# Patient Record
Sex: Female | Born: 1945 | Race: Black or African American | Hispanic: No | Marital: Single | Smoking: Former smoker
Health system: Southern US, Community
[De-identification: ages and names within clinical notes are randomized; demographics above are authoritative.]

## PROBLEM LIST (undated history)

## (undated) DIAGNOSIS — E119 Type 2 diabetes mellitus without complications: Secondary | ICD-10-CM

## (undated) DIAGNOSIS — I10 Essential (primary) hypertension: Secondary | ICD-10-CM

## (undated) DIAGNOSIS — J449 Chronic obstructive pulmonary disease, unspecified: Secondary | ICD-10-CM

## (undated) DIAGNOSIS — I509 Heart failure, unspecified: Secondary | ICD-10-CM

---

## 2013-02-17 DIAGNOSIS — E162 Hypoglycemia, unspecified: Secondary | ICD-10-CM

## 2014-03-14 ENCOUNTER — Emergency Department (HOSPITAL_COMMUNITY): Payer: Medicare Other

## 2014-03-14 ENCOUNTER — Inpatient Hospital Stay (HOSPITAL_COMMUNITY)
Admission: EM | Admit: 2014-03-14 | Discharge: 2014-04-16 | DRG: 296 | Disposition: E | Payer: Medicare Other | Attending: Internal Medicine | Admitting: Internal Medicine

## 2014-03-14 ENCOUNTER — Encounter (HOSPITAL_COMMUNITY): Payer: Self-pay | Admitting: Emergency Medicine

## 2014-03-14 DIAGNOSIS — Z66 Do not resuscitate: Secondary | ICD-10-CM | POA: Diagnosis not present

## 2014-03-14 DIAGNOSIS — Z515 Encounter for palliative care: Secondary | ICD-10-CM | POA: Diagnosis not present

## 2014-03-14 DIAGNOSIS — I129 Hypertensive chronic kidney disease with stage 1 through stage 4 chronic kidney disease, or unspecified chronic kidney disease: Secondary | ICD-10-CM | POA: Diagnosis present

## 2014-03-14 DIAGNOSIS — J9600 Acute respiratory failure, unspecified whether with hypoxia or hypercapnia: Secondary | ICD-10-CM | POA: Diagnosis present

## 2014-03-14 DIAGNOSIS — N179 Acute kidney failure, unspecified: Secondary | ICD-10-CM

## 2014-03-14 DIAGNOSIS — E872 Acidosis, unspecified: Secondary | ICD-10-CM | POA: Diagnosis present

## 2014-03-14 DIAGNOSIS — G931 Anoxic brain damage, not elsewhere classified: Secondary | ICD-10-CM | POA: Diagnosis present

## 2014-03-14 DIAGNOSIS — R4020 Unspecified coma: Secondary | ICD-10-CM

## 2014-03-14 DIAGNOSIS — R4182 Altered mental status, unspecified: Secondary | ICD-10-CM

## 2014-03-14 DIAGNOSIS — Z6841 Body Mass Index (BMI) 40.0 and over, adult: Secondary | ICD-10-CM | POA: Diagnosis not present

## 2014-03-14 DIAGNOSIS — G4733 Obstructive sleep apnea (adult) (pediatric): Secondary | ICD-10-CM | POA: Diagnosis present

## 2014-03-14 DIAGNOSIS — Z8673 Personal history of transient ischemic attack (TIA), and cerebral infarction without residual deficits: Secondary | ICD-10-CM | POA: Diagnosis not present

## 2014-03-14 DIAGNOSIS — N189 Chronic kidney disease, unspecified: Secondary | ICD-10-CM | POA: Diagnosis present

## 2014-03-14 DIAGNOSIS — N390 Urinary tract infection, site not specified: Secondary | ICD-10-CM | POA: Diagnosis present

## 2014-03-14 DIAGNOSIS — R7881 Bacteremia: Secondary | ICD-10-CM | POA: Diagnosis present

## 2014-03-14 DIAGNOSIS — B351 Tinea unguium: Secondary | ICD-10-CM | POA: Diagnosis present

## 2014-03-14 DIAGNOSIS — E662 Morbid (severe) obesity with alveolar hypoventilation: Secondary | ICD-10-CM | POA: Diagnosis present

## 2014-03-14 DIAGNOSIS — E785 Hyperlipidemia, unspecified: Secondary | ICD-10-CM | POA: Diagnosis present

## 2014-03-14 DIAGNOSIS — I2609 Other pulmonary embolism with acute cor pulmonale: Secondary | ICD-10-CM | POA: Diagnosis present

## 2014-03-14 DIAGNOSIS — J449 Chronic obstructive pulmonary disease, unspecified: Secondary | ICD-10-CM | POA: Diagnosis present

## 2014-03-14 DIAGNOSIS — I509 Heart failure, unspecified: Secondary | ICD-10-CM | POA: Diagnosis present

## 2014-03-14 DIAGNOSIS — Z87891 Personal history of nicotine dependence: Secondary | ICD-10-CM

## 2014-03-14 DIAGNOSIS — F29 Unspecified psychosis not due to a substance or known physiological condition: Secondary | ICD-10-CM | POA: Diagnosis present

## 2014-03-14 DIAGNOSIS — B954 Other streptococcus as the cause of diseases classified elsewhere: Secondary | ICD-10-CM | POA: Diagnosis present

## 2014-03-14 DIAGNOSIS — Z794 Long term (current) use of insulin: Secondary | ICD-10-CM | POA: Diagnosis not present

## 2014-03-14 DIAGNOSIS — J4489 Other specified chronic obstructive pulmonary disease: Secondary | ICD-10-CM | POA: Diagnosis present

## 2014-03-14 DIAGNOSIS — B070 Plantar wart: Secondary | ICD-10-CM | POA: Diagnosis present

## 2014-03-14 DIAGNOSIS — I498 Other specified cardiac arrhythmias: Secondary | ICD-10-CM | POA: Diagnosis present

## 2014-03-14 DIAGNOSIS — A498 Other bacterial infections of unspecified site: Secondary | ICD-10-CM | POA: Diagnosis present

## 2014-03-14 DIAGNOSIS — E1169 Type 2 diabetes mellitus with other specified complication: Secondary | ICD-10-CM | POA: Diagnosis present

## 2014-03-14 DIAGNOSIS — B9620 Unspecified Escherichia coli [E. coli] as the cause of diseases classified elsewhere: Secondary | ICD-10-CM

## 2014-03-14 DIAGNOSIS — I959 Hypotension, unspecified: Secondary | ICD-10-CM | POA: Diagnosis not present

## 2014-03-14 DIAGNOSIS — N170 Acute kidney failure with tubular necrosis: Secondary | ICD-10-CM | POA: Diagnosis present

## 2014-03-14 DIAGNOSIS — I5020 Unspecified systolic (congestive) heart failure: Secondary | ICD-10-CM | POA: Diagnosis present

## 2014-03-14 DIAGNOSIS — I50810 Right heart failure, unspecified: Secondary | ICD-10-CM

## 2014-03-14 DIAGNOSIS — IMO0002 Reserved for concepts with insufficient information to code with codable children: Secondary | ICD-10-CM | POA: Diagnosis present

## 2014-03-14 DIAGNOSIS — I469 Cardiac arrest, cause unspecified: Secondary | ICD-10-CM | POA: Diagnosis present

## 2014-03-14 DIAGNOSIS — J690 Pneumonitis due to inhalation of food and vomit: Secondary | ICD-10-CM | POA: Diagnosis present

## 2014-03-14 DIAGNOSIS — J9601 Acute respiratory failure with hypoxia: Secondary | ICD-10-CM

## 2014-03-14 DIAGNOSIS — B955 Unspecified streptococcus as the cause of diseases classified elsewhere: Secondary | ICD-10-CM

## 2014-03-14 DIAGNOSIS — G9340 Encephalopathy, unspecified: Secondary | ICD-10-CM | POA: Diagnosis present

## 2014-03-14 HISTORY — DX: Heart failure, unspecified: I50.9

## 2014-03-14 HISTORY — DX: Chronic obstructive pulmonary disease, unspecified: J44.9

## 2014-03-14 HISTORY — DX: Essential (primary) hypertension: I10

## 2014-03-14 HISTORY — DX: Type 2 diabetes mellitus without complications: E11.9

## 2014-03-14 LAB — URINALYSIS, ROUTINE W REFLEX MICROSCOPIC
GLUCOSE, UA: NEGATIVE mg/dL
Ketones, ur: NEGATIVE mg/dL
Nitrite: NEGATIVE
PH: 7 (ref 5.0–8.0)
Protein, ur: >300 mg/dL — AB
SPECIFIC GRAVITY, URINE: 1.015 (ref 1.005–1.030)
Urobilinogen, UA: 1 mg/dL (ref 0.0–1.0)

## 2014-03-14 LAB — CBC WITH DIFFERENTIAL/PLATELET
BASOS ABS: 0 10*3/uL (ref 0.0–0.1)
BASOS PCT: 0 % (ref 0–1)
Eosinophils Absolute: 0 10*3/uL (ref 0.0–0.7)
Eosinophils Relative: 0 % (ref 0–5)
HCT: 33.5 % — ABNORMAL LOW (ref 36.0–46.0)
Hemoglobin: 10.9 g/dL — ABNORMAL LOW (ref 12.0–15.0)
Lymphocytes Relative: 5 % — ABNORMAL LOW (ref 12–46)
Lymphs Abs: 0.6 10*3/uL — ABNORMAL LOW (ref 0.7–4.0)
MCH: 26.7 pg (ref 26.0–34.0)
MCHC: 32.5 g/dL (ref 30.0–36.0)
MCV: 82.1 fL (ref 78.0–100.0)
MONOS PCT: 6 % (ref 3–12)
Monocytes Absolute: 0.7 10*3/uL (ref 0.1–1.0)
Neutro Abs: 10.4 10*3/uL — ABNORMAL HIGH (ref 1.7–7.7)
Neutrophils Relative %: 89 % — ABNORMAL HIGH (ref 43–77)
Platelets: 190 10*3/uL (ref 150–400)
RBC: 4.08 MIL/uL (ref 3.87–5.11)
RDW: 19.7 % — ABNORMAL HIGH (ref 11.5–15.5)
WBC: 11.7 10*3/uL — ABNORMAL HIGH (ref 4.0–10.5)

## 2014-03-14 LAB — COMPREHENSIVE METABOLIC PANEL
ALK PHOS: 114 U/L (ref 39–117)
ALT: 28 U/L (ref 0–35)
AST: 57 U/L — AB (ref 0–37)
Albumin: 3.3 g/dL — ABNORMAL LOW (ref 3.5–5.2)
BILIRUBIN TOTAL: 2.5 mg/dL — AB (ref 0.3–1.2)
BUN: 37 mg/dL — AB (ref 6–23)
CO2: 20 mEq/L (ref 19–32)
Calcium: 9.2 mg/dL (ref 8.4–10.5)
Chloride: 99 mEq/L (ref 96–112)
Creatinine, Ser: 2.01 mg/dL — ABNORMAL HIGH (ref 0.50–1.10)
GFR calc Af Amer: 29 mL/min — ABNORMAL LOW (ref >90)
GFR calc non Af Amer: 25 mL/min — ABNORMAL LOW (ref >90)
Glucose, Bld: 44 mg/dL — CL (ref 70–99)
POTASSIUM: 4.7 meq/L (ref 3.7–5.3)
Sodium: 140 mEq/L (ref 137–147)
Total Protein: 7.7 g/dL (ref 6.0–8.3)

## 2014-03-14 LAB — TROPONIN I: Troponin I: <0.3 ng/mL (ref <0.30)

## 2014-03-14 LAB — I-STAT ARTERIAL BLOOD GAS, ED
Acid-base deficit: 4 mmol/L — ABNORMAL HIGH (ref 0.0–2.0)
Bicarbonate: 24.2 mEq/L — ABNORMAL HIGH (ref 20.0–24.0)
O2 SAT: 94 %
Patient temperature: 35.1
TCO2: 26 mmol/L (ref 0–100)
pCO2 arterial: 50.1 mmHg — ABNORMAL HIGH (ref 35.0–45.0)
pH, Arterial: 7.282 — ABNORMAL LOW (ref 7.350–7.450)
pO2, Arterial: 72 mmHg — ABNORMAL LOW (ref 80.0–100.0)

## 2014-03-14 LAB — URINE MICROSCOPIC-ADD ON

## 2014-03-14 LAB — PROTIME-INR
INR: 1.78 — ABNORMAL HIGH (ref 0.00–1.49)
Prothrombin Time: 20.2 seconds — ABNORMAL HIGH (ref 11.6–15.2)

## 2014-03-14 LAB — APTT: aPTT: 32 seconds (ref 24–37)

## 2014-03-14 LAB — I-STAT CG4 LACTIC ACID, ED: Lactic Acid, Venous: 6.65 mmol/L — ABNORMAL HIGH (ref 0.5–2.2)

## 2014-03-14 MED ORDER — FENTANYL BOLUS VIA INFUSION
50.0000 ug | INTRAVENOUS | Status: DC | PRN
  Filled 2014-03-14: qty 50

## 2014-03-14 MED ORDER — SODIUM CHLORIDE 0.9 % IV SOLN
25.0000 ug/h | INTRAVENOUS | Status: DC
  Administered 2014-03-15: 50 ug/h via INTRAVENOUS
  Administered 2014-03-16: 100 ug/h via INTRAVENOUS
  Filled 2014-03-14 (×2): qty 50

## 2014-03-14 MED ORDER — SODIUM CHLORIDE 0.9 % IV SOLN: 2000.0000 mL | Freq: Once | INTRAVENOUS | Status: DC

## 2014-03-14 MED ORDER — SODIUM CHLORIDE 0.9 % IV SOLN
1.0000 ug/kg/min | INTRAVENOUS | Status: DC
  Administered 2014-03-15 – 2014-03-16 (×3): 1.5 ug/kg/min via INTRAVENOUS
  Filled 2014-03-14 (×3): qty 20

## 2014-03-14 MED ORDER — NOREPINEPHRINE BITARTRATE 1 MG/ML IV SOLN
0.5000 ug/min | INTRAVENOUS | Status: DC
  Filled 2014-03-14: qty 4

## 2014-03-14 MED ORDER — PROPOFOL 10 MG/ML IV EMUL
5.0000 ug/kg/min | INTRAVENOUS | Status: DC
  Administered 2014-03-15 – 2014-03-16 (×10): 20 ug/kg/min via INTRAVENOUS
  Filled 2014-03-14 (×11): qty 100

## 2014-03-14 MED ORDER — CISATRACURIUM BOLUS VIA INFUSION
0.1000 mg/kg | Freq: Once | INTRAVENOUS | Status: AC
  Administered 2014-03-14: 18.1 mg via INTRAVENOUS
  Filled 2014-03-14: qty 19

## 2014-03-14 MED ORDER — SODIUM CHLORIDE 0.9 % IV SOLN
2000.0000 mL | Freq: Once | INTRAVENOUS | Status: AC
  Administered 2014-03-14: 2000 mL via INTRAVENOUS

## 2014-03-14 MED ORDER — CISATRACURIUM BOLUS VIA INFUSION
0.0500 mg/kg | INTRAVENOUS | Status: AC | PRN
  Administered 2014-03-15: 9.1 mg via INTRAVENOUS
  Filled 2014-03-14: qty 10

## 2014-03-14 MED ORDER — ARTIFICIAL TEARS OP OINT
1.0000 "application " | TOPICAL_OINTMENT | Freq: Three times a day (TID) | OPHTHALMIC | Status: DC
  Administered 2014-03-15 – 2014-03-16 (×6): 1 via OPHTHALMIC
  Filled 2014-03-14: qty 3.5

## 2014-03-14 MED ORDER — ETOMIDATE 2 MG/ML IV SOLN
30.0000 mg | Freq: Once | INTRAVENOUS | Status: AC
  Administered 2014-03-14: 30 mg via INTRAVENOUS

## 2014-03-14 MED ORDER — ASPIRIN 300 MG RE SUPP
300.0000 mg | RECTAL | Status: AC
  Administered 2014-03-15: 300 mg via RECTAL
  Filled 2014-03-14: qty 1

## 2014-03-14 MED ORDER — NOREPINEPHRINE BITARTRATE 1 MG/ML IV SOLN: 0.5000 ug/min | INTRAVENOUS | Status: DC

## 2014-03-14 MED ORDER — SODIUM CHLORIDE 0.9 % IV SOLN: 1.0000 ug/kg/min | INTRAVENOUS | Status: DC

## 2014-03-14 MED ORDER — NOREPINEPHRINE BITARTRATE 1 MG/ML IV SOLN
0.5000 ug/min | INTRAVENOUS | Status: DC
  Administered 2014-03-16 (×2): 12 ug/min via INTRAVENOUS
  Administered 2014-03-16: 5 ug/min via INTRAVENOUS
  Filled 2014-03-14 (×2): qty 4

## 2014-03-14 MED ORDER — HEPARIN SODIUM (PORCINE) 5000 UNIT/ML IJ SOLN
5000.0000 [IU] | Freq: Three times a day (TID) | INTRAMUSCULAR | Status: DC
  Administered 2014-03-15 – 2014-03-16 (×6): 5000 [IU] via SUBCUTANEOUS
  Filled 2014-03-14 (×7): qty 1

## 2014-03-14 MED ORDER — SUCCINYLCHOLINE CHLORIDE 20 MG/ML IJ SOLN
150.0000 mg | Freq: Once | INTRAMUSCULAR | Status: AC
  Administered 2014-03-14: 150 mg via INTRAVENOUS

## 2014-03-14 MED ORDER — MIDAZOLAM HCL 5 MG/ML IJ SOLN
1.0000 mg/h | INTRAMUSCULAR | Status: DC
  Filled 2014-03-14: qty 10

## 2014-03-14 MED ORDER — DEXTROSE 50 % IV SOLN
1.0000 | Freq: Once | INTRAVENOUS | Status: AC
  Administered 2014-03-14: 50 mL via INTRAVENOUS
  Filled 2014-03-14: qty 50

## 2014-03-14 NOTE — ED Notes (Signed)
Dr. Goldston at bedside.  

## 2014-03-14 NOTE — ED Notes (Signed)
Pulse check- good central pulses found by Elliot Gurney, Charity fundraiser.

## 2014-03-14 NOTE — Progress Notes (Signed)
Chaplain responded to CPR. No family present at this time.

## 2014-03-14 NOTE — ED Notes (Signed)
Respiratory therapist at bedside.

## 2014-03-14 NOTE — ED Notes (Signed)
IV infiltrated at this time. Verbal order from Dr. Marry Guan to hold all IV medications, to get patient to inpatient as soon as possible and he will insert central line upstairs. Patient remains unresponsive at this time. VSS.

## 2014-03-14 NOTE — ED Notes (Signed)
Patient presents to ED via [facility]. Initial EMS call by family was for "GI bleed." EMS arrived to find patient "unconscious and asystole"- son was performing CPR. EMS took over with CPR for approx 13 minutes and had a spontaneous return of pulses after 2 rounds of EPI. No shocks administered. King airway placed by EMS. NSR with return of pulses. 78/56. 128 bpm. IO in left leg placed by EMS. Pulses present upon arrival to ED.

## 2014-03-14 NOTE — ED Provider Notes (Signed)
CSN: [id number]     Arrival date & time    History   First MD Initiated Contact with Patient 2014/04/05 2158     Chief Complaint  Patient presents with  . Cardiac Arrest   Patient is a 68 y.o. female with PMH reported to be CHF, DM, and CVA who presents via EMS post CPR.  EMS reports they were called out for vaginal bleeding.  Upon their arrival, patient's was unresponsive and son was doing CPR.  EMS reports continued CPR and initial rhythm reported to be asystole.  Chest compressions for appx 13 minutes with 2 rounds of epi reported by EMS.  King airway placed.  ROSC was achieved and patient brought emergently to ED.  Additional information is not available at this time.    (Consider location/radiation/quality/duration/timing/severity/associated sxs/prior Treatment) Patient is a 68 y.o. female presenting with general illness. The history is provided by the patient and medical records. No language interpreter was used.  Illness Severity:  Severe Onset quality:  Unable to specify Timing:  Constant Progression:  Worsening Chronicity:  New   No past medical history on file. No past surgical history on file. No family history on file. History  Substance Use Topics  . Smoking status: Not on file  . Smokeless tobacco: Not on file  . Alcohol Use: Not on file   OB History   No data available     Review of Systems  Unable to perform ROS: Acuity of condition      Allergies  Review of patient's allergies indicates not on file.  Home Medications   Prior to Admission medications   Not on File   BP 130/81  Pulse 92  Resp 17  SpO2 92% Physical Exam  Nursing note and vitals reviewed. Constitutional: She appears well-developed and well-nourished.  HENT:  Head: Atraumatic.  Mouth/Throat: No oropharyngeal exudate (blood in OP).  Eyes: Pupils are equal, round, and reactive to light.  Neck: No tracheal deviation present.  Cardiovascular: Normal rate.   Pulmonary/Chest: She has no  wheezes. She has no rales.  King airway in place with bilateral breath sounds  Abdominal: Soft. Bowel sounds are normal. She exhibits no mass.  Obese abdomen  Musculoskeletal: She exhibits no edema.  Lymphadenopathy:    She has no cervical adenopathy.  Neurological: GCS eye subscore is 1. GCS verbal subscore is 1. GCS motor subscore is 1.  Patient with agonal respirations  Skin: Skin is warm and dry.    ED Course  INTUBATION Date/Time: 04/05/14 10:14 PM Performed by: Johnney Ou Authorized by: Pricilla Loveless T Consent: The procedure was performed in an emergent situation. Patient identity confirmed: arm band Time out: Immediately prior to procedure a "time out" was called to verify the correct patient, procedure, equipment, support staff and site/side marked as required. Indications: airway protection and  respiratory failure Intubation method: direct Patient status: paralyzed (RSI) Preoxygenation: King airway. Sedatives: etomidate Paralytic: succinylcholine Laryngoscope size: Mac 3 Tube size: 7.5 mm Tube type: cuffed Number of attempts: 2 Ventilation between attempts: BVM Cricoid pressure: no Cords visualized: yes Post-procedure assessment: chest rise and ETCO2 monitor Breath sounds: equal Cuff inflated: yes ETT to lip: 27 cm Tube secured with: ETT holder Chest x-ray interpreted by me, other physician and radiologist. Chest x-ray findings: endotracheal tube too low Tube repositioned: tube repositioned successfully Patient tolerance: Patient tolerated the procedure well with no immediate complications. Comments: Initial attempt with glidescope was unsuccessful as patient had copious amounts of blood in OP.     (  including critical care time) Labs Review Labs Reviewed  CBC WITH DIFFERENTIAL - Abnormal; Notable for the following:    WBC 11.7 (*)    Hemoglobin 10.9 (*)    HCT 33.5 (*)    RDW 19.7 (*)    Neutrophils Relative % 89 (*)    Neutro Abs 10.4 (*)     Lymphocytes Relative 5 (*)    Lymphs Abs 0.6 (*)    All other components within normal limits  COMPREHENSIVE METABOLIC PANEL - Abnormal; Notable for the following:    Glucose, Bld 44 (*)    BUN 37 (*)    Creatinine, Ser 2.01 (*)    Albumin 3.3 (*)    AST 57 (*)    Total Bilirubin 2.5 (*)    GFR calc non Af Amer 25 (*)    GFR calc Af Amer 29 (*)    All other components within normal limits  PROTIME-INR - Abnormal; Notable for the following:    Prothrombin Time 20.2 (*)    INR 1.78 (*)    All other components within normal limits  URINALYSIS, ROUTINE W REFLEX MICROSCOPIC - Abnormal; Notable for the following:    Color, Urine RED (*)    APPearance CLOUDY (*)    Hgb urine dipstick LARGE (*)    Bilirubin Urine SMALL (*)    Protein, ur >300 (*)    Leukocytes, UA LARGE (*)    All other components within normal limits  URINE MICROSCOPIC-ADD ON - Abnormal; Notable for the following:    Bacteria, UA MANY (*)    All other components within normal limits  I-STAT CG4 LACTIC ACID, ED - Abnormal; Notable for the following:    Lactic Acid, Venous 6.65 (*)    All other components within normal limits  I-STAT ARTERIAL BLOOD GAS, ED - Abnormal; Notable for the following:    pH, Arterial 7.282 (*)    pCO2 arterial 50.1 (*)    pO2, Arterial 72.0 (*)    Bicarbonate 24.2 (*)    Acid-base deficit 4.0 (*)    All other components within normal limits  CULTURE, BLOOD (ROUTINE X 2)  CULTURE, BLOOD (ROUTINE X 2)  URINE CULTURE  APTT  TROPONIN I  BLOOD GAS, ARTERIAL  BLOOD GAS, ARTERIAL  BASIC METABOLIC PANEL  BASIC METABOLIC PANEL  BASIC METABOLIC PANEL  BASIC METABOLIC PANEL  BASIC METABOLIC PANEL  BASIC METABOLIC PANEL  BASIC METABOLIC PANEL  PROTIME-INR  PROTIME-INR  APTT  APTT  CBC  CREATININE, SERUM    Imaging Review Ct Head Wo Contrast  03/05/2014   CLINICAL DATA:  68 year old female status post CPR. Initial encounter.  EXAM: CT HEAD WITHOUT CONTRAST  TECHNIQUE: Contiguous  axial images were obtained from the base of the skull through the vertex without intravenous contrast.  COMPARISON:  02/26/2014.  FINDINGS: Study is mildly degraded by motion artifact despite repeated imaging attempts.  No acute osseous abnormality identified. Continued right mastoid effusion. Mild opacification now the left mastoids. Mild paranasal sinus opacification. Opacification of the pharynx maybe fluid. No acute osseous abnormality identified.  Stable orbit and scalp soft tissues.  Stable cerebral volume. No ventriculomegaly. Stable hypodensity in the left corona radiata tracking toward the external capsule. No midline shift, mass effect, or evidence of intracranial mass lesion. No acute intracranial hemorrhage identified. No evidence of cortically based acute infarction identified. Calcified atherosclerosis at the skull base. Chronic appearing inferior left cerebellar infarct better demonstrated on prior.  IMPRESSION: Stable CT appearance of the brain  with chronic small vessel ischemia in the left hemisphere and left cerebellum. No acute intracranial abnormality identified.   Electronically Signed   By: Augusto GambleLee  Hall M.D.   On: October 21, 2013 23:19   Dg Chest Portable 1 View  2013/10/24   CLINICAL DATA:  68 year old female cardiac arrest. Initial encounter.  EXAM: PORTABLE CHEST - 1 VIEW  COMPARISON:  02/26/2014.  FINDINGS: Portable AP supine view at 2209 hrs. Intubated. Endotracheal tube tip at the carina.  Left perihilar consolidation with air bronchograms. No pneumothorax. Perhaps mild patchy right upper lobe opacity. No overt pulmonary edema. No effusion identified. Stable cardiomegaly and mediastinal contours. No acute osseous abnormality identified.  IMPRESSION: 1. Endotracheal tube tip at the carina. Retract 2 cm for more optimal placement. 2. Left upper lobe and perihilar consolidation with air bronchograms. Patchy right upper lobe opacity.   Electronically Signed   By: Augusto GambleLee  Hall M.D.   On: October 21, 2013  22:31     EKG Interpretation None      MDM   Final diagnoses:  Lactic acidosis  Cardiac arrest  Altered mental status    Patient presents to the emergency department via EMS after found to be pulseless and return of spontaneous circulation achieved. [facility] and this was replaced with a endotracheal tube. Please see procedure note above. Airway was difficult by patient's large body habitus and also blood in the airway. Chest x-ray showed patient's endotracheal tube was at the carina and thus retracted 2 cm. CT head showed no evidence of acute abnormality. Other labs remarkable for lactic acidosis. Chest x-ray shows some bilateral upper lobe opacities. Most likely etiology of aspiration. Critical care consult and hypothermia protocol initiated. Patient admitted to ICU for further evaluation and treatment. Urine resulted after patient moved to ICU and will defer antibiotics to ICU team.      Johnney Ouerek Asaf Elmquist, MD 03/15/14 0020

## 2014-03-14 NOTE — ED Notes (Signed)
Critical care admitting physician at bedside

## 2014-03-14 NOTE — ED Notes (Signed)
DR Criss Alvine GIVEN A COPY OF LACTIC ACID RESULTS 6.65

## 2014-03-14 NOTE — Progress Notes (Signed)
ETT pulled back from 25 @ lip to 23cm @ the lip. Per chest xray and MD order. RT suctioned copious amount of thick frothy bloody secretions from oral airway and moderate amounts of bloody secretions from ETT. Vitals are stable. SPO2 is 95% on 100%.

## 2014-03-15 ENCOUNTER — Inpatient Hospital Stay (HOSPITAL_COMMUNITY): Payer: Medicare Other

## 2014-03-15 ENCOUNTER — Encounter (HOSPITAL_COMMUNITY): Payer: Self-pay | Admitting: *Deleted

## 2014-03-15 DIAGNOSIS — J9600 Acute respiratory failure, unspecified whether with hypoxia or hypercapnia: Secondary | ICD-10-CM

## 2014-03-15 DIAGNOSIS — N189 Chronic kidney disease, unspecified: Secondary | ICD-10-CM

## 2014-03-15 DIAGNOSIS — N179 Acute kidney failure, unspecified: Secondary | ICD-10-CM

## 2014-03-15 DIAGNOSIS — R4020 Unspecified coma: Secondary | ICD-10-CM

## 2014-03-15 DIAGNOSIS — E872 Acidosis, unspecified: Secondary | ICD-10-CM

## 2014-03-15 DIAGNOSIS — J9601 Acute respiratory failure with hypoxia: Secondary | ICD-10-CM

## 2014-03-15 LAB — BASIC METABOLIC PANEL
BUN: 41 mg/dL — ABNORMAL HIGH (ref 6–23)
BUN: 41 mg/dL — ABNORMAL HIGH (ref 6–23)
BUN: 45 mg/dL — ABNORMAL HIGH (ref 6–23)
BUN: 45 mg/dL — AB (ref 6–23)
CHLORIDE: 101 meq/L (ref 96–112)
CHLORIDE: 100 meq/L (ref 96–112)
CHLORIDE: 100 meq/L (ref 96–112)
CO2: 20 meq/L (ref 19–32)
CO2: 21 meq/L (ref 19–32)
CO2: 21 mEq/L (ref 19–32)
CO2: 21 meq/L (ref 19–32)
CREATININE: 2.29 mg/dL — AB (ref 0.50–1.10)
Calcium: 9.3 mg/dL (ref 8.4–10.5)
Calcium: 9.2 mg/dL (ref 8.4–10.5)
Calcium: 9.3 mg/dL (ref 8.4–10.5)
Calcium: 9.3 mg/dL (ref 8.4–10.5)
Chloride: 98 mEq/L (ref 96–112)
Creatinine, Ser: 2.08 mg/dL — ABNORMAL HIGH (ref 0.50–1.10)
Creatinine, Ser: 2.18 mg/dL — ABNORMAL HIGH (ref 0.50–1.10)
Creatinine, Ser: 2.21 mg/dL — ABNORMAL HIGH (ref 0.50–1.10)
GFR calc Af Amer: 26 mL/min — ABNORMAL LOW (ref >90)
GFR calc Af Amer: 26 mL/min — ABNORMAL LOW (ref >90)
GFR calc Af Amer: 24 mL/min — ABNORMAL LOW (ref >90)
GFR calc non Af Amer: 24 mL/min — ABNORMAL LOW (ref >90)
GFR calc non Af Amer: 22 mL/min — ABNORMAL LOW (ref >90)
GFR calc non Af Amer: 22 mL/min — ABNORMAL LOW (ref >90)
GFR calc non Af Amer: 21 mL/min — ABNORMAL LOW (ref >90)
GFR, EST AFRICAN AMERICAN: 27 mL/min — AB (ref >90)
GLUCOSE: 135 mg/dL — AB (ref 70–99)
GLUCOSE: 147 mg/dL — AB (ref 70–99)
GLUCOSE: 155 mg/dL — AB (ref 70–99)
Glucose, Bld: 137 mg/dL — ABNORMAL HIGH (ref 70–99)
POTASSIUM: 4.6 meq/L (ref 3.7–5.3)
POTASSIUM: 4.2 meq/L (ref 3.7–5.3)
Potassium: 4.3 mEq/L (ref 3.7–5.3)
Potassium: 4 mEq/L (ref 3.7–5.3)
SODIUM: 143 meq/L (ref 137–147)
SODIUM: 141 meq/L (ref 137–147)
Sodium: 139 mEq/L (ref 137–147)
Sodium: 142 mEq/L (ref 137–147)

## 2014-03-15 LAB — POCT I-STAT, CHEM 8
BUN: 36 mg/dL — ABNORMAL HIGH (ref 6–23)
BUN: 38 mg/dL — ABNORMAL HIGH (ref 6–23)
CALCIUM ION: 1.1 mmol/L — AB (ref 1.13–1.30)
CALCIUM ION: 1.12 mmol/L — AB (ref 1.13–1.30)
CHLORIDE: 102 meq/L (ref 96–112)
CREATININE: 2.3 mg/dL — AB (ref 0.50–1.10)
Chloride: 103 mEq/L (ref 96–112)
Creatinine, Ser: 2.3 mg/dL — ABNORMAL HIGH (ref 0.50–1.10)
Glucose, Bld: 134 mg/dL — ABNORMAL HIGH (ref 70–99)
Glucose, Bld: 133 mg/dL — ABNORMAL HIGH (ref 70–99)
HCT: 40 % (ref 36.0–46.0)
HCT: 38 % (ref 36.0–46.0)
Hemoglobin: 13.6 g/dL (ref 12.0–15.0)
Hemoglobin: 12.9 g/dL (ref 12.0–15.0)
Potassium: 4.1 mEq/L (ref 3.7–5.3)
Potassium: 4.1 mEq/L (ref 3.7–5.3)
Sodium: 141 mEq/L (ref 137–147)
Sodium: 143 mEq/L (ref 137–147)
TCO2: 23 mmol/L (ref 0–100)
TCO2: 22 mmol/L (ref 0–100)

## 2014-03-15 LAB — POCT I-STAT 3, ART BLOOD GAS (G3+)
Acid-Base Excess: 1 mmol/L (ref 0.0–2.0)
Bicarbonate: 24.9 mEq/L — ABNORMAL HIGH (ref 20.0–24.0)
O2 Saturation: 100 %
TCO2: 26 mmol/L (ref 0–100)
pCO2 arterial: 33.6 mmHg — ABNORMAL LOW (ref 35.0–45.0)
pH, Arterial: 7.468 — ABNORMAL HIGH (ref 7.350–7.450)
pO2, Arterial: 157 mmHg — ABNORMAL HIGH (ref 80.0–100.0)

## 2014-03-15 LAB — GLUCOSE, CAPILLARY
GLUCOSE-CAPILLARY: 108 mg/dL — AB (ref 70–99)
GLUCOSE-CAPILLARY: 110 mg/dL — AB (ref 70–99)
GLUCOSE-CAPILLARY: 132 mg/dL — AB (ref 70–99)
GLUCOSE-CAPILLARY: 69 mg/dL — AB (ref 70–99)
GLUCOSE-CAPILLARY: 96 mg/dL (ref 70–99)
Glucose-Capillary: 138 mg/dL — ABNORMAL HIGH (ref 70–99)
Glucose-Capillary: 60 mg/dL — ABNORMAL LOW (ref 70–99)
Glucose-Capillary: 113 mg/dL — ABNORMAL HIGH (ref 70–99)
Glucose-Capillary: 118 mg/dL — ABNORMAL HIGH (ref 70–99)
Glucose-Capillary: 118 mg/dL — ABNORMAL HIGH (ref 70–99)
Glucose-Capillary: 132 mg/dL — ABNORMAL HIGH (ref 70–99)

## 2014-03-15 LAB — APTT: APTT: 34 s (ref 24–37)

## 2014-03-15 LAB — CARBOXYHEMOGLOBIN
CARBOXYHEMOGLOBIN: 1.2 % (ref 0.5–1.5)
Carboxyhemoglobin: 1.5 % (ref 0.5–1.5)
METHEMOGLOBIN: 1 % (ref 0.0–1.5)
Methemoglobin: 1.2 % (ref 0.0–1.5)
O2 SAT: 63.3 %
O2 Saturation: 73.1 %
TOTAL HEMOGLOBIN: 10.8 g/dL — AB (ref 12.0–16.0)
TOTAL HEMOGLOBIN: 11.1 g/dL — AB (ref 12.0–16.0)

## 2014-03-15 LAB — MRSA PCR SCREENING: MRSA by PCR: POSITIVE — AB

## 2014-03-15 LAB — MAGNESIUM: Magnesium: 2.1 mg/dL (ref 1.5–2.5)

## 2014-03-15 LAB — TROPONIN I
Troponin I: <0.3 ng/mL (ref <0.30)
Troponin I: <0.3 ng/mL (ref <0.30)

## 2014-03-15 LAB — PHOSPHORUS: Phosphorus: 4.9 mg/dL — ABNORMAL HIGH (ref 2.3–4.6)

## 2014-03-15 LAB — PROTIME-INR
INR: 1.77 — AB (ref 0.00–1.49)
PROTHROMBIN TIME: 20.1 s — AB (ref 11.6–15.2)

## 2014-03-15 LAB — LACTIC ACID, PLASMA: LACTIC ACID, VENOUS: 2.9 mmol/L — AB (ref 0.5–2.2)

## 2014-03-15 MED ORDER — MUPIROCIN 2 % EX OINT
1.0000 "application " | TOPICAL_OINTMENT | Freq: Two times a day (BID) | CUTANEOUS | Status: AC
  Administered 2014-03-15 – 2014-03-19 (×9): 1 via NASAL
  Filled 2014-03-15: qty 22

## 2014-03-15 MED ORDER — FUROSEMIDE 10 MG/ML IJ SOLN
INTRAMUSCULAR | Status: AC
  Filled 2014-03-15: qty 8

## 2014-03-15 MED ORDER — DEXTROSE 50 % IV SOLN: 50.0000 mL | Freq: Once | INTRAVENOUS | Status: AC | PRN

## 2014-03-15 MED ORDER — CHLORHEXIDINE GLUCONATE CLOTH 2 % EX PADS
6.0000 | MEDICATED_PAD | Freq: Every day | CUTANEOUS | Status: AC
  Administered 2014-03-15 – 2014-03-19 (×5): 6 via TOPICAL

## 2014-03-15 MED ORDER — DEXTROSE 50 % IV SOLN: 25.0000 mL | Freq: Once | INTRAVENOUS | Status: AC | PRN

## 2014-03-15 MED ORDER — DEXTROSE 50 % IV SOLN
50.0000 mL | Freq: Once | INTRAVENOUS | Status: AC
  Administered 2014-03-15: 50 mL via INTRAVENOUS

## 2014-03-15 MED ORDER — SODIUM CHLORIDE 0.9 % IV SOLN
INTRAVENOUS | Status: DC
  Administered 2014-03-15 – 2014-03-19 (×4): via INTRAVENOUS

## 2014-03-15 MED ORDER — FUROSEMIDE 10 MG/ML IJ SOLN
80.0000 mg | Freq: Once | INTRAMUSCULAR | Status: AC
  Administered 2014-03-15: 80 mg via INTRAVENOUS

## 2014-03-15 MED ORDER — CHLORHEXIDINE GLUCONATE 0.12 % MT SOLN
15.0000 mL | Freq: Two times a day (BID) | OROMUCOSAL | Status: DC
  Administered 2014-03-15 – 2014-03-21 (×13): 15 mL via OROMUCOSAL
  Filled 2014-03-15 (×13): qty 15

## 2014-03-15 MED ORDER — PIPERACILLIN-TAZOBACTAM 3.375 G IVPB
3.3750 g | Freq: Three times a day (TID) | INTRAVENOUS | Status: DC
  Administered 2014-03-15 – 2014-03-17 (×8): 3.375 g via INTRAVENOUS
  Filled 2014-03-15 (×10): qty 50

## 2014-03-15 MED ORDER — DEXTROSE 50 % IV SOLN
INTRAVENOUS | Status: AC
  Administered 2014-03-15: 50 mL via INTRAVENOUS
  Filled 2014-03-15: qty 50

## 2014-03-15 MED ORDER — INSULIN ASPART 100 UNIT/ML ~~LOC~~ SOLN
2.0000 [IU] | SUBCUTANEOUS | Status: DC
  Administered 2014-03-16: 4 [IU] via SUBCUTANEOUS
  Administered 2014-03-16: 2 [IU] via SUBCUTANEOUS
  Administered 2014-03-16: 3 [IU] via SUBCUTANEOUS
  Administered 2014-03-16: 4 [IU] via SUBCUTANEOUS
  Administered 2014-03-16: 2 [IU] via SUBCUTANEOUS
  Administered 2014-03-17: 4 [IU] via SUBCUTANEOUS
  Administered 2014-03-17 – 2014-03-18 (×3): 2 [IU] via SUBCUTANEOUS
  Administered 2014-03-18: 4 [IU] via SUBCUTANEOUS
  Administered 2014-03-18: 2 [IU] via SUBCUTANEOUS
  Administered 2014-03-18: 4 [IU] via SUBCUTANEOUS
  Administered 2014-03-19 (×3): 2 [IU] via SUBCUTANEOUS
  Administered 2014-03-19: 4 [IU] via SUBCUTANEOUS
  Administered 2014-03-19 – 2014-03-20 (×2): 2 [IU] via SUBCUTANEOUS
  Administered 2014-03-20: 4 [IU] via SUBCUTANEOUS
  Administered 2014-03-20: 2 [IU] via SUBCUTANEOUS
  Administered 2014-03-20: 4 [IU] via SUBCUTANEOUS
  Administered 2014-03-20 – 2014-03-21 (×3): 2 [IU] via SUBCUTANEOUS

## 2014-03-15 MED ORDER — IPRATROPIUM-ALBUTEROL 0.5-2.5 (3) MG/3ML IN SOLN
3.0000 mL | RESPIRATORY_TRACT | Status: DC
  Administered 2014-03-15 – 2014-03-21 (×39): 3 mL via RESPIRATORY_TRACT
  Filled 2014-03-15 (×39): qty 3

## 2014-03-15 MED ORDER — "THROMBI-PAD 3""X3"" EX PADS"
1.0000 | MEDICATED_PAD | Freq: Once | CUTANEOUS | Status: DC
  Filled 2014-03-15: qty 1

## 2014-03-15 MED ORDER — BIOTENE DRY MOUTH MT LIQD
15.0000 mL | Freq: Four times a day (QID) | OROMUCOSAL | Status: DC
  Administered 2014-03-15 – 2014-03-21 (×25): 15 mL via OROMUCOSAL

## 2014-03-15 MED ORDER — PANTOPRAZOLE SODIUM 40 MG IV SOLR
40.0000 mg | Freq: Every day | INTRAVENOUS | Status: DC
  Administered 2014-03-15 – 2014-03-20 (×6): 40 mg via INTRAVENOUS
  Filled 2014-03-15 (×7): qty 40

## 2014-03-15 NOTE — Procedures (Signed)
Arterial Catheter Insertion Procedure Note Martha Gross [id number] 1945/11/14  Procedure: Insertion of Arterial Catheter  Indications: Blood pressure monitoring  Procedure Details Consent: Unable to obtain consent because of emergent medical necessity. Time Out: Verified patient identification, verified procedure, site/side was marked, verified correct patient position, special equipment/implants available, medications/allergies/relevent history reviewed, required imaging and test results available.  Performed  Maximum sterile technique was used including antiseptics, cap, gloves, gown, hand hygiene, mask and sheet. Skin prep: Chlorhexidine; local anesthetic administered 22 gauge catheter was inserted into right radial artery using the Seldinger technique. Procedure done under direct visualization with [place].  Evaluation Blood flow good; BP tracing good. Complications: No apparent complications.   [name] 03/15/2014

## 2014-03-15 NOTE — H&P (Signed)
PULMONARY / CRITICAL CARE MEDICINE   Name: Martha Gross MRN: [id number] DOB: 1945-12-17    ADMISSION DATE:  02/18/2014  PRIMARY SERVICE: PCCM  CHIEF COMPLAINT:   Witnessed cardiac arrest.  BRIEF PATIENT DESCRIPTION:  68 years old morbid obese AA female with PMH relevant for DM, HTN, dyslipidemia, CHF with unknown LVEF, ex smoker, COPD and recent minor ischemic stroke. Presents brought by EMS after sustaining witnessed cardiac arrest at home. Intubated at arrival to the ED. EKG with no evidence of STEMI. Down time unclear. As per the family EMS took a long time to get there, CPR by family member for 30 minutes to an hour, EMS did about 13 minutes and 2 rounds of epi. Initial rhythm was asystole. Patient unresponsive.  SIGNIFICANT EVENTS / STUDIES:  - CT scan of the head with no evidence of intracranial bleed.   LINES / TUBES: - IO  CULTURES: - Blood cultures sent - Tracheal aspirate culture ordered - Urine culture ordered  ANTIBIOTICS: - Zosyn  HISTORY OF PRESENT ILLNESS:   68 years old morbid obese AA female with PMH relevant for DM, HTN, dyslipidemia, CHF with unknown LVEF, ex smoker, COPD and recent minor ischemic stroke. Was in her usual state of health yesterday. This morning she did not get out of bed but was awake and alert. Hours later still in bed. Her daughter tried to help her get out of bed and became unresponsive. CPR was initiated by family member and EMS was called. As per the family. Police got there after 5 minutes but EMS took 30 minutes to an hour to get there. EMS did CPR with [facility] after 13 minutes and 2 rounds of epi. Initial rhythm was asystole. At arrival to the ED unresponsive. Intubated in the ED. EKG with no ST or T wave changes, sinus rhythm.   PAST MEDICAL HISTORY :  Past Medical History  Diagnosis Date  . CHF (congestive heart failure)   . Diabetes mellitus without complication    No past surgical history on file. Prior to Admission medications   Not  on File   Not on File  FAMILY HISTORY:  No family history on file. SOCIAL HISTORY:  has no tobacco, alcohol, and drug history on file.  REVIEW OF SYSTEMS:   Unable to provide.  SUBJECTIVE:   VITAL SIGNS: Temp:  [95.2 F (35.1 C)-95.4 F (35.2 C)] 95.2 F (35.1 C) (05/29 2347) Pulse Rate:  [74-95] 76 (05/29 2347) Resp:  [9-34] 12 (05/29 2347) BP: (102-130)/(31-91) 121/86 mmHg (05/29 2346) SpO2:  [86 %-100 %] 100 % (05/29 2347) FiO2 (%):  [100 %] 100 % (05/29 2210) Weight:  [400 lb (181.439 kg)] 400 lb (181.439 kg) (05/29 2229) HEMODYNAMICS:   VENTILATOR SETTINGS: Vent Mode:  [-] PRVC FiO2 (%):  [100 %] 100 % Set Rate:  [14 bmp-26 bmp] 26 bmp Vt Set:  [500 mL] 500 mL PEEP:  [5 cmH20] 5 cmH20 Plateau Pressure:  [36 cmH20] 36 cmH20 INTAKE / OUTPUT: Intake/Output     05/29 0701 - 05/30 0700   I.V. (mL/kg) 2000 (11)   Total Intake(mL/kg) 2000 (11)   Urine (mL/kg/hr) 15   Total Output 15   Net +1985         PHYSICAL EXAMINATION: General: Morbid obese. Intubated, unresponsive, no acute distress. Eyes: Anicteric sclerae. Pupils are pinpoint and slightly reactive to light.  ENT: ETT in place. Trachea at midline.  Lymph: No cervical, supraclavicular, or axillary lymphadenopathy. Heart: Normal S1, S2. No murmurs, rubs, or  gallops appreciated. No bruits, equal pulses. Lungs: Normal excursion, no dullness to percussion. Good air movement bilaterally, without wheezes or crackles.  Abdomen: Abdomen soft,  not distended, normoactive bowel sounds. No hepatosplenomegaly or masses. Musculoskeletal: No clubbing or synovitis.  Skin: No rashes or lesions Neuro: Patient is unresponsive to verbal or painful stimuli.   LABS:  CBC  Recent Labs Lab 06-06-2014 2152  WBC 11.7*  HGB 10.9*  HCT 33.5*  PLT 190   Coag's  Recent Labs Lab 06-06-2014 2152  APTT 32  INR 1.78*   BMET  Recent Labs Lab 06-06-2014 2152  NA 140  K 4.7  CL 99  CO2 20  BUN 37*  CREATININE 2.01*   GLUCOSE 44*   Electrolytes  Recent Labs Lab 06-06-2014 2152  CALCIUM 9.2   Sepsis Markers  Recent Labs Lab 06-06-2014 2207  LATICACIDVEN 6.65*   ABG  Recent Labs Lab 06-06-2014 2248  PHART 7.282*  PCO2ART 50.1*  PO2ART 72.0*   Liver Enzymes  Recent Labs Lab 06-06-2014 2152  AST 57*  ALT 28  ALKPHOS 114  BILITOT 2.5*  ALBUMIN 3.3*   Cardiac Enzymes  Recent Labs Lab 06-06-2014 2158  TROPONINI <0.30   Glucose No results found for this basename: GLUCAP,  in the last 168 hours  Imaging Ct Head Wo Contrast  06-22-14   CLINICAL DATA:  68 year old female status post CPR. Initial encounter.  EXAM: CT HEAD WITHOUT CONTRAST  TECHNIQUE: Contiguous axial images were obtained from the base of the skull through the vertex without intravenous contrast.  COMPARISON:  02/26/2014.  FINDINGS: Study is mildly degraded by motion artifact despite repeated imaging attempts.  No acute osseous abnormality identified. Continued right mastoid effusion. Mild opacification now the left mastoids. Mild paranasal sinus opacification. Opacification of the pharynx maybe fluid. No acute osseous abnormality identified.  Stable orbit and scalp soft tissues.  Stable cerebral volume. No ventriculomegaly. Stable hypodensity in the left corona radiata tracking toward the external capsule. No midline shift, mass effect, or evidence of intracranial mass lesion. No acute intracranial hemorrhage identified. No evidence of cortically based acute infarction identified. Calcified atherosclerosis at the skull base. Chronic appearing inferior left cerebellar infarct better demonstrated on prior.  IMPRESSION: Stable CT appearance of the brain with chronic small vessel ischemia in the left hemisphere and left cerebellum. No acute intracranial abnormality identified.   Electronically Signed   By: Augusto GambleLee  Hall M.D.   On: 009-06-15 23:19   Dg Chest Portable 1 View  06-22-14   CLINICAL DATA:  68 year old female cardiac arrest.  Initial encounter.  EXAM: PORTABLE CHEST - 1 VIEW  COMPARISON:  02/26/2014.  FINDINGS: Portable AP supine view at 2209 hrs. Intubated. Endotracheal tube tip at the carina.  Left perihilar consolidation with air bronchograms. No pneumothorax. Perhaps mild patchy right upper lobe opacity. No overt pulmonary edema. No effusion identified. Stable cardiomegaly and mediastinal contours. No acute osseous abnormality identified.  IMPRESSION: 1. Endotracheal tube tip at the carina. Retract 2 cm for more optimal placement. 2. Left upper lobe and perihilar consolidation with air bronchograms. Patchy right upper lobe opacity.   Electronically Signed   By: Augusto GambleLee  Hall M.D.   On: 009-06-15 22:31     CXR:  - Cardiomegaly  - Bilateral infiltrates concerning for pneumonia / aspiration  ASSESSMENT / PLAN:  PULMONARY A: 1) Acute hypoxemic respiratory failure post cardiac arrest. 2) Questionable aspiration  3) History of COPD P:   - Mechanical ventilation   - PRVC, Vt: 8cc/kg,  PEEP: 5, RR: 16, FiO2: 100% and adjust to keep O2 sat > 94%   - VAP prevention order set - Zosyn - Duonebs q 4 hrs   CARDIOVASCULAR A:  1) Cardiac arrest, unclear etiology, differential includes primary cardiac event, hypercarbic failure, PE 2) Systolic heart failure, bedside echo personally done showed LVEF of 10 to 15 % with severely dilated RV. P:  - Hypothermia protocol started  - Serial troponin - Echocardiogram - Mixed venous sat.  - Lasix 80 mg IV once - Cardiology consult called  RENAL A:   1) Acute renal failure, likely pre renal P:   - IVF resuscitation - Will follow chemistry   GASTROINTESTINAL A:   1) No issues P:   - GI prophylaxis with protonix  HEMATOLOGIC A:   1) Questionable history of vaginal bleed vs urinary tract bleed. Unable to determine. Urine is bloody tinged. On exam no evidence of significant bleeding.  P:  - DVT prophylaxis with SQ heparin - Will follow CBC  INFECTIOUS A:   1)  Bilateral lung infiltrates. Questionable aspiration pneumonia 2) UTI P:   - Zosyn - Will follow cultures  ENDOCRINE A:   1) DM on insulin 2) Hypoglycemic at admission. Has gotten 2 amps of D50 P:   - Hypoglycemia protocol  - Novolog sliding scale  NEUROLOGIC A:   1) Post cardiac arrest. Likely anoxic brain injury 2) CT head with no intracranial bleed. P:   - Sedation and paralysis per hypothermia protocol    I have personally obtained a history, examined the patient, evaluated laboratory and imaging results, formulated the assessment and plan and placed orders. CRITICAL CARE: The patient is critically ill with multiple organ systems failure and requires high complexity decision making for assessment and support, frequent evaluation and titration of therapies, application of advanced monitoring technologies and extensive interpretation of multiple databases. Critical Care Time devoted to patient care services described in this note is 60 minutes.   Overton Mam, MD Pulmonary and Critical Care Medicine [facility] Pager: [phone]  03/15/2014, 12:07 AM

## 2014-03-15 NOTE — Procedures (Signed)
Central Venous Catheter Insertion Procedure Note Martha Gross [id number] Mar 12, 1946  Procedure: Insertion of Central Venous Catheter Indications: Assessment of intravascular volume, Drug and/or fluid administration and Frequent blood sampling  Procedure Details Consent: Unable to obtain consent because of emergent medical necessity. Time Out: Verified patient identification, verified procedure, site/side was marked, verified correct patient position, special equipment/implants available, medications/allergies/relevent history reviewed, required imaging and test results available.  Performed  Maximum sterile technique was used including antiseptics, cap, gloves, gown, hand hygiene, mask and sheet. Skin prep: Chlorhexidine; local anesthetic administered A antimicrobial bonded/coated triple lumen catheter was placed in the left internal jugular vein using the Seldinger technique. Procedure done under direct visualization with [place].  Evaluation Blood flow good Complications: No apparent complications Patient did tolerate procedure well. Chest X-ray ordered to verify placement.  CXR: normal.  Martha Gross 03/15/2014, 2:24 AM

## 2014-03-15 NOTE — Progress Notes (Signed)
INITIAL NUTRITION ASSESSMENT  DOCUMENTATION CODES Per approved criteria  -Morbid Obesity   INTERVENTION: - If pt unable to be extubated in the next 24-48 hours, recommend TF initiation of Vital High Protein at 71ml/hr increase by 51ml every 4 hours to goal of 44ml/hr with Prostat 64ml TID. This will provide 1320 calories, 153g protein, and free water. Goal rate plus current calories from Propofol will provide a total of 1896 calories and meet 79% estimated calorie needs and 100% estimated protein needs - RD to continue to monitor   NUTRITION DIAGNOSIS: Inadequate oral intake related to inability to eat as evidenced by NPO.   Goal: Enteral nutrition to provide 60-70% of estimated calorie needs (22-25 kcals/kg ideal body weight) and 100% of estimated protein needs, based on ASPEN guidelines for permissive underfeeding in critically ill obese individuals  Monitor:  Weights, labs, TF initiation/tolerance, vent statuts  Reason for Assessment: Ventilated pt   68 y.o. female  Admitting Dx: Cardiac arrest  ASSESSMENT: Pt with hx of DM, HTN, dyslipidemia, CHF, ex smoker, COPD and recent minor ischemic stroke. Presents brought by EMS after sustaining witnessed cardiac arrest at home. Intubated at arrival to the ED. Head CT negative for bleed, cooling protocol started as patient remained unresponsive after arrival. Neurologic prognosis is guarded.  Patient is currently intubated on ventilator support MV: 12.6 L/min Temp (24hrs), Avg:95.2 F (35.1 C), Min:90 F (32.2 C), Max:96.1 F (35.6 C)  Propofol: 21.8 ml/hr - provides 576 calories    BUN/Cr elevated with low GFR Total bilirubin elevated     Height: Ht Readings from Last 1 Encounters:  18-Mar-2014 5\' 7"  (1.702 m)    Weight: Wt Readings from Last 1 Encounters:  03/15/14 384 lb 0.7 oz (174.2 kg)    Ideal Body Weight: 135 lbs  % Ideal Body Weight: 284%  Wt Readings from Last 10 Encounters:  03/15/14 384 lb 0.7 oz  (174.2 kg)    Usual Body Weight: Unable to assess    BMI:  Body mass index is 60.14 kg/(m^2). Class III extreme obesity   Estimated Nutritional Needs: Kcal: 2391 Protein: 153g Fluid: per MD  Skin: +2 generalized, RLE, and LLE edema, abdominal and elbow incision  Diet Order:  NPO  EDUCATION NEEDS: -No education needs identified at this time   Intake/Output Summary (Last 24 hours) at 03/15/14 0833 Last data filed at 03/15/14 0800  Gross per 24 hour  Intake 2417.2 ml  Output    365 ml  Net 2052.2 ml    Last BM: 5/30  Labs:   Recent Labs Lab 03/18/2014 2152 03/15/14 0230 03/15/14 0613  NA 140 143 141  K 4.7 4.6 4.1  CL 99 101 103  CO2 20 20  --   BUN 37* 41* 36*  CREATININE 2.01* 2.08* 2.30*  CALCIUM 9.2 9.3  --   GLUCOSE 44* 137* 134*    CBG (last 3)  No results found for this basename: GLUCAP,  in the last 72 hours  Scheduled Meds: . antiseptic oral rinse  15 mL Mouth Rinse QID  . artificial tears  1 application Both Eyes 3 times per day  . chlorhexidine  15 mL Mouth Rinse BID  . Chlorhexidine Gluconate Cloth  6 each Topical Q0600  . heparin  5,000 Units Subcutaneous 3 times per day  . insulin aspart  2-6 Units Subcutaneous 6 times per day  . ipratropium-albuterol  3 mL Nebulization Q4H  . mupirocin ointment  1 application Nasal BID  . pantoprazole (  PROTONIX) IV  40 mg Intravenous Daily  . piperacillin-tazobactam (ZOSYN)  IV  3.375 g Intravenous 3 times per day  . THROMBI-PAD  1 each Topical Once    Continuous Infusions: . sodium chloride 10 mL/hr at 03/15/14 0830  . cisatracurium (NIMBEX) infusion 1.5 mcg/kg/min (03/15/14 0700)  . fentaNYL infusion INTRAVENOUS 100 mcg/hr (03/15/14 0700)  . midazolam (VERSED) infusion    . norepinephrine (LEVOPHED) Adult infusion    . propofol 20 mcg/kg/min (03/15/14 [id number])    Past Medical History  Diagnosis Date  . CHF (congestive heart failure)   . Diabetes mellitus without complication   . COPD (chronic  obstructive pulmonary disease)   . Hypertension     History reviewed. No pertinent past surgical history.   Levon HedgerHeather Baron MS, RD, LDN [phone] Weekend/After Hours Pager

## 2014-03-15 NOTE — ED Provider Notes (Signed)
I saw and evaluated the patient, reviewed the resident's note and I agree with the findings and plan.   EKG Interpretation   Date/Time:  Friday Mar 14 2014 22:07:47 EDT Ventricular Rate:  85 PR Interval:    QRS Duration: 85 QT Interval:  538 QTC Calculation: 640 R Axis:   108 Text Interpretation:  Accelerated junctional rhythm Right axis deviation  Low voltage, extremity and precordial leads Prolonged QT interval No old  tracing to compare Confirmed by Karelly Dewalt  MD, Monaye Blackie ([id number]) on 03/15/2014  4:41:26 PM      Angiocath insertion Performed by: Audree Camel  Consent: Verbal consent obtained. Risks and benefits: risks, benefits and alternatives were discussed Time out: Immediately prior to procedure a "time out" was called to verify the correct patient, procedure, equipment, support staff and site/side marked as required.  Preparation: Patient was prepped and draped in the usual sterile fashion.  Vein Location: right basilic  Ultrasound Guided  Gauge: 18  Normal blood return and flush without difficulty Patient tolerance: Patient tolerated the procedure well with no immediate complications.    CRITICAL CARE Performed by: Audree Camel   Total critical care time: 40 minutes  Critical care time was exclusive of separately billable procedures and treating other patients.  Critical care was necessary to treat or prevent imminent or life-threatening deterioration.  Critical care was time spent personally by me on the following activities: development of treatment plan with patient and/or surrogate as well as nursing, discussions with consultants, evaluation of patient's response to treatment, examination of patient, obtaining history from patient or surrogate, ordering and performing treatments and interventions, ordering and review of laboratory studies, ordering and review of radiographic studies, pulse oximetry and re-evaluation of patient's condition.    Patient  s/p CPR. Started early by family member, resuscitated by EMS and returned after 2 epis and about 13 min of CPR. Here she is unresponsive but somewhat breathing on own through [facility] Airway. Intubated by Dr. Ronald Lobo. I was present for this entire procedure. Had desaturation to mid 70s due to difficulty of her airway, but able to get airway and oxygen returned to normal. Given her quick CPR and ROSC, will cool patient. No obvious sign of why she went into arrest. No evidence of STEMI or obvious infection. Will admit to critical care.     Audree Camel, MD 03/15/14 [phone]

## 2014-03-15 NOTE — Consult Note (Signed)
CARDIOLOGY CONSULT NOTE   Patient ID: Martha Gross MRN: [id number], DOB/AGE: 06-19-1946   Admit date: 03/04/2014 Date of Consult: 03/15/2014   Primary Physician: No primary provider on file. Primary Cardiologist: None  Pt. Profile  38F with DM, HTN, dyslipidemia, morbid obesity CHF with unknown LVEF, ex smoker, COPD and recent minor ischemic stroke admitted s/p witnessed cardiac arrest with prolonged down time (~45 minutes) and initial rhythm of asystole.    Problem List  Past Medical History  Diagnosis Date  . CHF (congestive heart failure)   . Diabetes mellitus without complication   . COPD (chronic obstructive pulmonary disease)   . Hypertension     History reviewed. No pertinent past surgical history.   Allergies  No Known Allergies  HPI   38F with DM, HTN, dyslipidemia, morbid obesity CHF with unknown LVEF, ex smoker, COPD and recent minor ischemic stroke. She is admitted s/p cardiac arrest at home.  Family performed CPR for ~30 minutes. Once EMS arrived, he was intubated, initial rhythm was asystole. After an additional 13 minutes of CPR ROSC was achieved. ECG demonstrated NSR, no STEMI. Head CT negative for bleed, cooling protocol started as patient remained unresponsive after arrival.    Inpatient Medications  . antiseptic oral rinse  15 mL Mouth Rinse QID  . artificial tears  1 application Both Eyes 3 times per day  . chlorhexidine  15 mL Mouth Rinse BID  . Chlorhexidine Gluconate Cloth  6 each Topical Q0600  . heparin  5,000 Units Subcutaneous 3 times per day  . insulin aspart  2-6 Units Subcutaneous 6 times per day  . ipratropium-albuterol  3 mL Nebulization Q4H  . mupirocin ointment  1 application Nasal BID  . pantoprazole (PROTONIX) IV  40 mg Intravenous Daily  . piperacillin-tazobactam (ZOSYN)  IV  3.375 g Intravenous 3 times per day  . THROMBI-PAD  1 each Topical Once    Family History History reviewed. No pertinent family history.   Social  History History   Social History  . Marital Status: Single    Spouse Name: N/A    Number of Children: N/A  . Years of Education: N/A   Occupational History  . Not on file.   Social History Main Topics  . Smoking status: Former Games developer  . Smokeless tobacco: Never Used  . Alcohol Use: No  . Drug Use: Not on file  . Sexual Activity: No   Other Topics Concern  . Not on file   Social History Narrative  . No narrative on file     Review of Systems Unable to obtain  Physical Exam  Blood pressure 110/92, pulse 57, temperature 95.9 F (35.5 C), resp. rate 0, height 5\' 7"  (1.702 m), weight 174.2 kg (384 lb 0.7 oz), SpO2 100.00%.  General: Intubated, sedated Neuro: Paralyzed HEENT: ETT with blood around OP  Lungs:  Coarse anterior breath sounds Heart: Distant, regular. Abdomen: Obese, non-tender Extremities: Cool (in setting of cooling protocol), 1+ bilateral LE edema  Labs   Recent Labs  02/21/2014 2158  TROPONINI <0.30   Lab Results  Component Value Date   WBC 11.7* 02/20/2014   HGB 13.6 03/15/2014   HCT 40.0 03/15/2014   MCV 82.1 03/16/2014   PLT 190 03/06/2014    Recent Labs Lab 02/23/2014 2152 03/15/14 0230 03/15/14 0613  NA 140 143 141  K 4.7 4.6 4.1  CL 99 101 103  CO2 20 20  --   BUN 37* 41* 36*  CREATININE 2.01*  2.08* 2.30*  CALCIUM 9.2 9.3  --   PROT 7.7  --   --   BILITOT 2.5*  --   --   ALKPHOS 114  --   --   ALT 28  --   --   AST 57*  --   --   GLUCOSE 44* 137* 134*   No results found for this basename: CHOL, HDL, LDLCALC, TRIG   No results found for this basename: DDIMER    Radiology/Studies  Ct Head Wo Contrast  02/16/2014   CLINICAL DATA:  68 year old female status post CPR. Initial encounter.  EXAM: CT HEAD WITHOUT CONTRAST  TECHNIQUE: Contiguous axial images were obtained from the base of the skull through the vertex without intravenous contrast.  COMPARISON:  02/26/2014.  FINDINGS: Study is mildly degraded by motion artifact despite  repeated imaging attempts.  No acute osseous abnormality identified. Continued right mastoid effusion. Mild opacification now the left mastoids. Mild paranasal sinus opacification. Opacification of the pharynx maybe fluid. No acute osseous abnormality identified.  Stable orbit and scalp soft tissues.  Stable cerebral volume. No ventriculomegaly. Stable hypodensity in the left corona radiata tracking toward the external capsule. No midline shift, mass effect, or evidence of intracranial mass lesion. No acute intracranial hemorrhage identified. No evidence of cortically based acute infarction identified. Calcified atherosclerosis at the skull base. Chronic appearing inferior left cerebellar infarct better demonstrated on prior.  IMPRESSION: Stable CT appearance of the brain with chronic small vessel ischemia in the left hemisphere and left cerebellum. No acute intracranial abnormality identified.   Electronically Signed   By: Augusto GambleLee  Hall M.D.   On: 03/06/2014 23:19   Dg Chest Port 1 View  03/15/2014   CLINICAL DATA:  68 year old female central line placement. Initial encounter.  EXAM: PORTABLE CHEST - 1 VIEW  COMPARISON:  02/19/2014 and earlier.  FINDINGS: Portable AP supine view at 0127 hrs. New left IJ approach central venous catheter, tip projects to the right of midline just above the carina.  ET tube tip in better position, now just below the clavicles. Enteric tube courses to the abdomen, tip not included.  No pneumothorax. Mildly improved lung volumes and decrease left perihilar consolidation. Improved ventilation in the left lung. Patchy opacity in the right lung not significantly changed.  Stable cardiac size and mediastinal contours.  IMPRESSION: 1. Left IJ central line placed, tip at the level of the SVC. No pneumothorax. 2. Improved ventilation the left lung. 3. Enteric tube placed, courses to the abdomen. ET tube in good position.   Electronically Signed   By: Augusto GambleLee  Hall M.D.   On: 03/15/2014 02:11   Dg  Chest Portable 1 View  02/19/2014   CLINICAL DATA:  68 year old female cardiac arrest. Initial encounter.  EXAM: PORTABLE CHEST - 1 VIEW  COMPARISON:  02/26/2014.  FINDINGS: Portable AP supine view at 2209 hrs. Intubated. Endotracheal tube tip at the carina.  Left perihilar consolidation with air bronchograms. No pneumothorax. Perhaps mild patchy right upper lobe opacity. No overt pulmonary edema. No effusion identified. Stable cardiomegaly and mediastinal contours. No acute osseous abnormality identified.  IMPRESSION: 1. Endotracheal tube tip at the carina. Retract 2 cm for more optimal placement. 2. Left upper lobe and perihilar consolidation with air bronchograms. Patchy right upper lobe opacity.   Electronically Signed   By: Augusto GambleLee  Hall M.D.   On: 03/03/2014 22:31    ECG NSR, RAD, prolonged QTc  ASSESSMENT AND PLAN  69F with DM, HTN, dyslipidemia, morbid obesity CHF  with unknown LVEF, ex smoker, COPD and recent minor ischemic stroke admitted s/p witnessed cardiac arrest with prolonged down time (~45 minutes) and initial rhythm of asystole. She is currently being cooled and is hemodynamically stable. Her neurologic prognosis is guarded.   Would recommend: 1. TTE  2. Cycle cardiac biomarkers 3. If neurologic recovery occurs, will likely plan for ICD and cardiac cath   Signed, Glori Luis, MD 03/15/2014, 7:05 AM

## 2014-03-15 NOTE — Progress Notes (Signed)
Pt trasnported on vent from ED to 2H01. No complications noted. Pt suctioned copious amounts of pink tinged frothy secretions from ETT.

## 2014-03-15 NOTE — Progress Notes (Signed)
Pt transported from ED to CT. No complications noted.

## 2014-03-15 NOTE — Progress Notes (Signed)
ANTIBIOTIC CONSULT NOTE - INITIAL  Pharmacy Consult for Zosyn  Indication: Aspiration PNA  Not on File  Patient Measurements: Height: 5\' 7"  (170.2 cm) Weight: 384 lb 0.7 oz (174.2 kg) IBW/kg (Calculated) : 61.6   Labs:  Recent Labs  03/13/2014 2152  WBC 11.7*  HGB 10.9*  PLT 190  CREATININE 2.01*   Estimated Creatinine Clearance: 46.3 ml/min (by C-G formula based on Cr of 2.01).  Medical History: Past Medical History  Diagnosis Date  . CHF (congestive heart failure)   . Diabetes mellitus without complication    Assessment: 68 y/o witnessed cardiac arrest now on arctic sun protocol to start Zosyn for aspiration PNA. WBC 11.7, Scr 2.01, other labs as above.   Plan:  Zosyn 3.375G IV q8h to be infused over 4 hours Trend WBC, temp, renal function  F/U any cultures, imaging  Abran Duke 03/15/2014,12:40 AM

## 2014-03-15 NOTE — H&P (Addendum)
PULMONARY / CRITICAL CARE MEDICINE   Name: Martha Gross MRN: [id number] DOB: 1946/01/26    ADMISSION DATE:  03/16/2014  PRIMARY SERVICE: PCCM  CHIEF COMPLAINT:   Witnessed cardiac arrest.  BRIEF PATIENT DESCRIPTION:  68 years old morbid obese AA female with PMH relevant for DM, HTN, dyslipidemia, CHF with unknown LVEF, ex smoker, COPD and recent minor ischemic stroke. Presents brought by EMS after sustaining witnessed cardiac arrest at home. Intubated at arrival to the ED. EKG with no evidence of STEMI. Down time unclear. As per the family EMS took a long time to get there, CPR by family member for 30 minutes to an hour, EMS did about 13 minutes and 2 rounds of epi. Initial rhythm was asystole. Patient unresponsive.  SIGNIFICANT EVENTS / STUDIES:  - CT scan of the head with no evidence of intracranial bleed.   LINES / TUBES: - IO - CVL 5/3\0 - A line 5/30  CULTURES: - Blood cultures sent - Tracheal aspirate culture ordered - Urine culture ordered  ANTIBIOTICS: - Zosyn    SUBJECTIVE:   5/2\30/15: QTc prolonged at 0.58. Not on pressors or QTc prolonging drugs. On hypothermia: reached goal 5.15am today.    VITAL SIGNS: Temp:  [90 F (32.2 C)-96.1 F (35.6 C)] 91.6 F (33.1 C) (05/30 0900) Pulse Rate:  [52-95] 59 (05/30 0900) Resp:  [0-34] 15 (05/30 0900) BP: (51-130)/(13-103) 110/92 mmHg (05/30 0215) SpO2:  [86 %-100 %] 100 % (05/30 0900) Arterial Line BP: (133-172)/(69-118) 136/69 mmHg (05/30 0900) FiO2 (%):  [70 %-100 %] 70 % (05/30 0800) Weight:  [174.2 kg (384 lb 0.7 oz)-181.439 kg (400 lb)] 174.2 kg (384 lb 0.7 oz) (05/30 0023) HEMODYNAMICS: CVP:  [24 mmHg-39 mmHg] 24 mmHg VENTILATOR SETTINGS: Vent Mode:  [-] PRVC FiO2 (%):  [70 %-100 %] 70 % Set Rate:  [14 bmp-26 bmp] 26 bmp Vt Set:  [500 mL] 500 mL PEEP:  [5 cmH20] 5 cmH20 Plateau Pressure:  [29 cmH20-36 cmH20] 29 cmH20 INTAKE / OUTPUT: Intake/Output     05/29 0701 - 05/30 0700 05/30 0701 - 05/31 0700    I.V. (mL/kg) 2259.1 (13) 116.2 (0.7)   IV Piggyback 100    Total Intake(mL/kg) 2359.1 (13.5) 116.2 (0.7)   Urine (mL/kg/hr) 365    Total Output 365     Net +1994.1 +116.2          PHYSICAL EXAMINATION: General: Morbid obese. Intubated, unresponsive, no acute distress. Eyes: Anicteric sclerae. Pupils are pinpoint and slightly reactive to light.  ENT: ETT in place. Trachea at midline.  Lymph: No cervical, supraclavicular, or axillary lymphadenopathy. Heart: Normal S1, S2. No murmurs, rubs, or gallops appreciated. No bruits, equal pulses. Lungs: Normal excursion, no dullness to percussion. Good air movement bilaterally, without wheezes or crackles.  Abdomen: Abdomen soft,  not distended, normoactive bowel sounds. No hepatosplenomegaly or masses. Musculoskeletal: No clubbing or synovitis.  Skin: No rashes or lesions Neuro: Patient is unresponsive to verbal or painful stimuli. - on sedation and paralysis  LABS:  PULMONARY  Recent Labs Lab 02/24/2014 2248 03/15/14 0200 03/15/14 0434 03/15/14 0613 03/15/14 0750 03/15/14 0854  PHART 7.282*  --  7.468*  --   --   --   PCO2ART 50.1*  --  33.6*  --   --   --   PO2ART 72.0*  --  157.0*  --   --   --   HCO3 24.2*  --  24.9*  --   --   --   TCO2 26  --  26 23  --  22  O2SAT 94.0 63.3 100.0  --  73.1  --     CBC  Recent Labs Lab 03/12/2014 2152 03/15/14 0613 03/15/14 0854  HGB 10.9* 13.6 12.9  HCT 33.5* 40.0 38.0  WBC 11.7*  --   --   PLT 190  --   --     COAGULATION  Recent Labs Lab 03/04/2014 2152 03/15/14 0230  INR 1.78* 1.77*    CARDIAC   Recent Labs Lab 02/15/2014 2158  TROPONINI <0.30   No results found for this basename: PROBNP,  in the last 168 hours   CHEMISTRY  Recent Labs Lab 03/13/2014 2152 03/15/14 0230 03/15/14 0613 03/15/14 0854  NA 140 143 141 143  K 4.7 4.6 4.1 4.1  CL 99 101 103 102  CO2 20 20  --   --   GLUCOSE 44* 137* 134* 133*  BUN 37* 41* 36* 38*  CREATININE 2.01* 2.08* 2.30* 2.30*   CALCIUM 9.2 9.3  --   --    Estimated Creatinine Clearance: 40.5 ml/min (by C-G formula based on Cr of 2.3).   LIVER  Recent Labs Lab 02/20/2014 2152 03/15/14 0230  AST 57*  --   ALT 28  --   ALKPHOS 114  --   BILITOT 2.5*  --   PROT 7.7  --   ALBUMIN 3.3*  --   INR 1.78* 1.77*     INFECTIOUS  Recent Labs Lab 03/13/2014 2207  LATICACIDVEN 6.65*     ENDOCRINE CBG (last 3)   Recent Labs  03/15/14 0803  GLUCAP 138*         IMAGING x48h  Ct Head Wo Contrast  02/14/2014   CLINICAL DATA:  68 year old female status post CPR. Initial encounter.  EXAM: CT HEAD WITHOUT CONTRAST  TECHNIQUE: Contiguous axial images were obtained from the base of the skull through the vertex without intravenous contrast.  COMPARISON:  02/26/2014.  FINDINGS: Study is mildly degraded by motion artifact despite repeated imaging attempts.  No acute osseous abnormality identified. Continued right mastoid effusion. Mild opacification now the left mastoids. Mild paranasal sinus opacification. Opacification of the pharynx maybe fluid. No acute osseous abnormality identified.  Stable orbit and scalp soft tissues.  Stable cerebral volume. No ventriculomegaly. Stable hypodensity in the left corona radiata tracking toward the external capsule. No midline shift, mass effect, or evidence of intracranial mass lesion. No acute intracranial hemorrhage identified. No evidence of cortically based acute infarction identified. Calcified atherosclerosis at the skull base. Chronic appearing inferior left cerebellar infarct better demonstrated on prior.  IMPRESSION: Stable CT appearance of the brain with chronic small vessel ischemia in the left hemisphere and left cerebellum. No acute intracranial abnormality identified.   Electronically Signed   By: Augusto Gamble M.D.   On: 02/24/2014 23:19   Dg Chest Port 1 View  03/15/2014   CLINICAL DATA:  68 year old female central line placement. Initial encounter.  EXAM: PORTABLE  CHEST - 1 VIEW  COMPARISON:  03/07/2014 and earlier.  FINDINGS: Portable AP supine view at 0127 hrs. New left IJ approach central venous catheter, tip projects to the right of midline just above the carina.  ET tube tip in better position, now just below the clavicles. Enteric tube courses to the abdomen, tip not included.  No pneumothorax. Mildly improved lung volumes and decrease left perihilar consolidation. Improved ventilation in the left lung. Patchy opacity in the right lung not significantly changed.  Stable cardiac size and mediastinal contours.  IMPRESSION: 1. Left IJ central line placed, tip at the level of the SVC. No pneumothorax. 2. Improved ventilation the left lung. 3. Enteric tube placed, courses to the abdomen. ET tube in good position.   Electronically Signed   By: Augusto Gamble M.D.   On: 03/15/2014 02:11   Dg Chest Portable 1 View  02/19/2014   CLINICAL DATA:  68 year old female cardiac arrest. Initial encounter.  EXAM: PORTABLE CHEST - 1 VIEW  COMPARISON:  02/26/2014.  FINDINGS: Portable AP supine view at 2209 hrs. Intubated. Endotracheal tube tip at the carina.  Left perihilar consolidation with air bronchograms. No pneumothorax. Perhaps mild patchy right upper lobe opacity. No overt pulmonary edema. No effusion identified. Stable cardiomegaly and mediastinal contours. No acute osseous abnormality identified.  IMPRESSION: 1. Endotracheal tube tip at the carina. Retract 2 cm for more optimal placement. 2. Left upper lobe and perihilar consolidation with air bronchograms. Patchy right upper lobe opacity.   Electronically Signed   By: Augusto Gamble M.D.   On: 02/28/2014 22:31      ASSESSMENT / PLAN:  PULMONARY A: 1) Acute hypoxemic respiratory failure post cardiac arrest. 2) Questionable aspiration  3) History of COPD   - does not meet sbt criterial P:   - Mechanical ventilation   - PRVC, Vt: 8cc/kg, PEEP: 5, RR: 16, FiO2: 100% and adjust to keep O2 sat > 94%   - VAP prevention order  set - Zosyn - Duonebs q 4 hrs   CARDIOVASCULAR A:  1) Cardiac arrest, unclear etiology, differential includes primary cardiac event, hypercarbic failure, PE 2) Systolic heart failure, bedside echo done showed LVEF of 10 to 15 % with severely dilated RV.   - not on pressors. QTc prolonged; not known to be on any drugs that do that. Could be cause of arrest P:  - Hypothermia protocol rewarm 5.15am 03/16/14 - Serial troponin - Echocardiogram - Cardiology consult appreciated  RENAL A:   1) Acute renal failure, likely pre renal P:   - IVF resuscitation - Will follow chemistry   GASTROINTESTINAL A:   1) No issues P:   - GI prophylaxis with protonix  HEMATOLOGIC A:   1) Questionable history of vaginal bleed vs urinary tract bleed. Unable to determine. Urine is bloody tinged. On exam no evidence of significant bleeding.  P:  - DVT prophylaxis with SQ heparin - Will follow CBC  INFECTIOUS A:   1) Bilateral lung infiltrates. Questionable aspiration pneumonia 2) UTI P:   - Zosyn - Will follow cultures  ENDOCRINE A:   1) DM on insulin 2) Hypoglycemic at admission. Has gotten 2 amps of D50 P:   - Hypoglycemia protocol  - Novolog sliding scale  NEUROLOGIC A:   1) Post cardiac arrest. Likely anoxic brain injury 2) CT head with no intracranial bleed. P:   - Sedation and paralysis per hypothermia protocol     GLOBAL  - 03/15/14: no family at bedside    The patient is critically ill with multiple organ systems failure and requires high complexity decision making for assessment and support, frequent evaluation and titration of therapies, application of advanced monitoring technologies and extensive interpretation of multiple databases.   Critical Care Time devoted to patient care services described in this note is  35  Minutes.  Dr. Kalman Shan, M.D., [facility].C.P Pulmonary and Critical Care Medicine Staff Physician  System [place] [facility]  [facility] Pager: [phone], If no answer or between  15:00h - 7:00h:  call [phone]  03/15/2014 10:12 AM

## 2014-03-16 ENCOUNTER — Inpatient Hospital Stay (HOSPITAL_COMMUNITY): Payer: Medicare Other

## 2014-03-16 DIAGNOSIS — I469 Cardiac arrest, cause unspecified: Principal | ICD-10-CM

## 2014-03-16 DIAGNOSIS — N179 Acute kidney failure, unspecified: Secondary | ICD-10-CM

## 2014-03-16 DIAGNOSIS — N189 Chronic kidney disease, unspecified: Secondary | ICD-10-CM

## 2014-03-16 DIAGNOSIS — E872 Acidosis, unspecified: Secondary | ICD-10-CM

## 2014-03-16 DIAGNOSIS — J9600 Acute respiratory failure, unspecified whether with hypoxia or hypercapnia: Secondary | ICD-10-CM

## 2014-03-16 DIAGNOSIS — I369 Nonrheumatic tricuspid valve disorder, unspecified: Secondary | ICD-10-CM

## 2014-03-16 DIAGNOSIS — N19 Unspecified kidney failure: Secondary | ICD-10-CM

## 2014-03-16 LAB — PHOSPHORUS: PHOSPHORUS: 5.2 mg/dL — AB (ref 2.3–4.6)

## 2014-03-16 LAB — BASIC METABOLIC PANEL
BUN: 45 mg/dL — ABNORMAL HIGH (ref 6–23)
BUN: 46 mg/dL — AB (ref 6–23)
BUN: 47 mg/dL — ABNORMAL HIGH (ref 6–23)
BUN: 47 mg/dL — ABNORMAL HIGH (ref 6–23)
BUN: 48 mg/dL — AB (ref 6–23)
CALCIUM: 9.3 mg/dL (ref 8.4–10.5)
CALCIUM: 9.1 mg/dL (ref 8.4–10.5)
CHLORIDE: 100 meq/L (ref 96–112)
CHLORIDE: 99 meq/L (ref 96–112)
CO2: 21 mEq/L (ref 19–32)
CO2: 21 mEq/L (ref 19–32)
CO2: 21 mEq/L (ref 19–32)
CO2: 22 meq/L (ref 19–32)
CO2: 22 meq/L (ref 19–32)
CREATININE: 2.42 mg/dL — AB (ref 0.50–1.10)
CREATININE: 2.55 mg/dL — AB (ref 0.50–1.10)
Calcium: 9 mg/dL (ref 8.4–10.5)
Calcium: 9 mg/dL (ref 8.4–10.5)
Calcium: 9.2 mg/dL (ref 8.4–10.5)
Chloride: 99 mEq/L (ref 96–112)
Chloride: 101 mEq/L (ref 96–112)
Chloride: 99 mEq/L (ref 96–112)
Creatinine, Ser: 2.32 mg/dL — ABNORMAL HIGH (ref 0.50–1.10)
Creatinine, Ser: 2.48 mg/dL — ABNORMAL HIGH (ref 0.50–1.10)
Creatinine, Ser: 2.5 mg/dL — ABNORMAL HIGH (ref 0.50–1.10)
GFR calc Af Amer: 23 mL/min — ABNORMAL LOW (ref >90)
GFR calc Af Amer: 22 mL/min — ABNORMAL LOW (ref >90)
GFR calc Af Amer: 21 mL/min — ABNORMAL LOW (ref >90)
GFR calc non Af Amer: 19 mL/min — ABNORMAL LOW (ref >90)
GFR calc non Af Amer: 19 mL/min — ABNORMAL LOW (ref >90)
GFR calc non Af Amer: 21 mL/min — ABNORMAL LOW (ref >90)
GFR, EST AFRICAN AMERICAN: 22 mL/min — AB (ref >90)
GFR, EST AFRICAN AMERICAN: 24 mL/min — AB (ref >90)
GFR, EST NON AFRICAN AMERICAN: 20 mL/min — AB (ref >90)
GFR, EST NON AFRICAN AMERICAN: 19 mL/min — AB (ref >90)
GLUCOSE: 172 mg/dL — AB (ref 70–99)
Glucose, Bld: 161 mg/dL — ABNORMAL HIGH (ref 70–99)
Glucose, Bld: 166 mg/dL — ABNORMAL HIGH (ref 70–99)
Glucose, Bld: 175 mg/dL — ABNORMAL HIGH (ref 70–99)
Glucose, Bld: 182 mg/dL — ABNORMAL HIGH (ref 70–99)
POTASSIUM: 3.9 meq/L (ref 3.7–5.3)
POTASSIUM: 3.8 meq/L (ref 3.7–5.3)
Potassium: 3.7 mEq/L (ref 3.7–5.3)
Potassium: 3.9 mEq/L (ref 3.7–5.3)
Potassium: 3.7 mEq/L (ref 3.7–5.3)
SODIUM: 140 meq/L (ref 137–147)
Sodium: 141 mEq/L (ref 137–147)
Sodium: 143 mEq/L (ref 137–147)
Sodium: 142 mEq/L (ref 137–147)
Sodium: 140 mEq/L (ref 137–147)

## 2014-03-16 LAB — CBC WITH DIFFERENTIAL/PLATELET
BASOS PCT: 0 % (ref 0–1)
Basophils Absolute: 0 10*3/uL (ref 0.0–0.1)
EOS ABS: 0 10*3/uL (ref 0.0–0.7)
EOS PCT: 0 % (ref 0–5)
HCT: 31.5 % — ABNORMAL LOW (ref 36.0–46.0)
Hemoglobin: 10.4 g/dL — ABNORMAL LOW (ref 12.0–15.0)
LYMPHS ABS: 0.5 10*3/uL — AB (ref 0.7–4.0)
Lymphocytes Relative: 5 % — ABNORMAL LOW (ref 12–46)
MCH: 26.3 pg (ref 26.0–34.0)
MCHC: 33 g/dL (ref 30.0–36.0)
MCV: 79.5 fL (ref 78.0–100.0)
Monocytes Absolute: 1.1 10*3/uL — ABNORMAL HIGH (ref 0.1–1.0)
Monocytes Relative: 11 % (ref 3–12)
Neutro Abs: 8.7 10*3/uL — ABNORMAL HIGH (ref 1.7–7.7)
Neutrophils Relative %: 85 % — ABNORMAL HIGH (ref 43–77)
PLATELETS: 129 10*3/uL — AB (ref 150–400)
RBC: 3.96 MIL/uL (ref 3.87–5.11)
RDW: 20 % — AB (ref 11.5–15.5)
WBC: 10.3 10*3/uL (ref 4.0–10.5)

## 2014-03-16 LAB — MAGNESIUM: Magnesium: 2.1 mg/dL (ref 1.5–2.5)

## 2014-03-16 LAB — GLUCOSE, CAPILLARY
GLUCOSE-CAPILLARY: 163 mg/dL — AB (ref 70–99)
Glucose-Capillary: 118 mg/dL — ABNORMAL HIGH (ref 70–99)
Glucose-Capillary: 134 mg/dL — ABNORMAL HIGH (ref 70–99)
Glucose-Capillary: 136 mg/dL — ABNORMAL HIGH (ref 70–99)
Glucose-Capillary: 148 mg/dL — ABNORMAL HIGH (ref 70–99)
Glucose-Capillary: 161 mg/dL — ABNORMAL HIGH (ref 70–99)

## 2014-03-16 LAB — PROTIME-INR
INR: 1.67 — ABNORMAL HIGH (ref 0.00–1.49)
Prothrombin Time: 19.2 seconds — ABNORMAL HIGH (ref 11.6–15.2)

## 2014-03-16 LAB — TROPONIN I: Troponin I: <0.3 ng/mL (ref <0.30)

## 2014-03-16 LAB — LACTIC ACID, PLASMA: Lactic Acid, Venous: 3.2 mmol/L — ABNORMAL HIGH (ref 0.5–2.2)

## 2014-03-16 LAB — PRO B NATRIURETIC PEPTIDE: PRO B NATRI PEPTIDE: 3139 pg/mL — AB (ref 0–125)

## 2014-03-16 LAB — TRIGLYCERIDES: Triglycerides: 163 mg/dL — ABNORMAL HIGH (ref <150)

## 2014-03-16 MED ORDER — HEPARIN SODIUM (PORCINE) 5000 UNIT/ML IJ SOLN
5000.0000 [IU] | Freq: Three times a day (TID) | INTRAMUSCULAR | Status: DC
  Administered 2014-03-17 – 2014-03-21 (×13): 5000 [IU] via SUBCUTANEOUS
  Filled 2014-03-16 (×16): qty 1

## 2014-03-16 MED ORDER — NOREPINEPHRINE BITARTRATE 1 MG/ML IV SOLN
0.5000 ug/min | INTRAVENOUS | Status: DC
  Administered 2014-03-16: 10 ug/min via INTRAVENOUS
  Filled 2014-03-16: qty 16

## 2014-03-16 MED ORDER — NOREPINEPHRINE BITARTRATE 1 MG/ML IV SOLN
2.0000 ug/min | INTRAVENOUS | Status: DC
  Administered 2014-03-18: 2 ug/min via INTRAVENOUS
  Filled 2014-03-16 (×2): qty 4

## 2014-03-16 MED ORDER — ALBUTEROL SULFATE (2.5 MG/3ML) 0.083% IN NEBU
INHALATION_SOLUTION | RESPIRATORY_TRACT | Status: AC
  Filled 2014-03-16: qty 3

## 2014-03-16 MED ORDER — SODIUM CHLORIDE 0.9 % IV BOLUS (SEPSIS)
500.0000 mL | Freq: Once | INTRAVENOUS | Status: AC
  Administered 2014-03-16: 500 mL via INTRAVENOUS

## 2014-03-16 NOTE — Progress Notes (Addendum)
PULMONARY / CRITICAL CARE MEDICINE   Name: Martha Gross MRN: [id number] DOB: 08/14/1946    ADMISSION DATE:  March 31, 2014  PRIMARY SERVICE: PCCM  CHIEF COMPLAINT:   Witnessed cardiac arrest.  BRIEF PATIENT DESCRIPTION:  68 years old morbid obese AA female with PMH relevant for DM, HTN, dyslipidemia, CHF with unknown LVEF, ex smoker, COPD and recent minor ischemic stroke. Presents brought by EMS after sustaining witnessed cardiac arrest at home. Intubated at arrival to the ED. EKG with no evidence of STEMI. Down time unclear. As per the family EMS took a long time to get there, CPR by family member for 30 minutes to an hour, EMS did about 13 minutes and 2 rounds of epi. Initial rhythm was asystole. Patient unresponsive.  LINES / TUBES: - IO - CVL 5/3\0 - A line 5/30  CULTURES: - Blood cultures sent - Tracheal aspirate culture ordered - Urine culture ordered  ANTIBIOTICS: - Zosyn    SIGNIFICANT EVENTS / STUDIES:  - CT scan of the head with no evidence of intracranial bleed.  - 03/15/14: QTc prolonged at 0.58. Not on pressors or QTc prolonging drugs. On hypothermia: reached goal 5.15am today.    SUBJECTIVE/OVERNIGHT/INTERVAL HX 03/16/14:  rewarming in progress. Bedside echo with low EF. Formal echo pending. On perssors  VITAL SIGNS: Temp:  [91.2 F (32.9 C)-93.2 F (34 C)] 93.2 F (34 C) (05/31 0900) Pulse Rate:  [55-95] 95 (05/31 0900) Resp:  [0-26] 26 (05/31 0319) BP: (126-138)/(57-75) 126/57 mmHg (05/31 0733) SpO2:  [93 %-100 %] 100 % (05/31 0900) Arterial Line BP: (101-147)/(53-80) 135/61 mmHg (05/31 0900) FiO2 (%):  [40 %-50 %] 40 % (05/31 0800) HEMODYNAMICS: CVP:  [16 mmHg-24 mmHg] 16 mmHg VENTILATOR SETTINGS: Vent Mode:  [-] PRVC FiO2 (%):  [40 %-50 %] 40 % Set Rate:  [26 bmp] 26 bmp Vt Set:  [500 mL] 500 mL PEEP:  [5 cmH20] 5 cmH20 Plateau Pressure:  [23 cmH20-28 cmH20] 26 cmH20 INTAKE / OUTPUT: Intake/Output     05/30 0701 - 05/31 0700 05/31 0701 - 06/01  0700   I.V. (mL/kg) 1431.9 (8.2) 146.2 (0.8)   NG/GT 20    IV Piggyback 150    Total Intake(mL/kg) 1601.9 (9.2) 146.2 (0.8)   Urine (mL/kg/hr) 385 (0.1) 60 (0.2)   Emesis/NG output 200 (0)    Total Output 585 60   Net +1016.9 +86.2          PHYSICAL EXAMINATION: General: Morbid obese. Intubated, unresponsive, no acute distress. Eyes: Anicteric sclerae. Pupils are pinpoint and slightly reactive to light.  ENT: ETT in place.  Lymph: No cervical, supraclavicular, or axillary lymphadenopathy. Heart: Normal S1, S2. No murmurs, rubs, or gallops appreciated. No bruits, equal pulses. Lungs: Normal excursion, no dullness to percussion. Good air movement bilaterally, without wheezes or crackles.  Abdomen: Abdomen soft,  not distended, normoactive bowel sounds. No hepatosplenomegaly or masses. Musculoskeletal: No clubbing or synovitis.  Skin: No rashes or lesions Neuro: Patient is unresponsive to verbal or painful stimuli. - on sedation and paralysis  LABS:  PULMONARY  Recent Labs Lab 03/31/14 2248 03/15/14 0200 03/15/14 0434 03/15/14 0613 03/15/14 0750 03/15/14 0854  PHART 7.282*  --  7.468*  --   --   --   PCO2ART 50.1*  --  33.6*  --   --   --   PO2ART 72.0*  --  157.0*  --   --   --   HCO3 24.2*  --  24.9*  --   --   --  TCO2 26  --  26 23  --  22  O2SAT 94.0 63.3 100.0  --  73.1  --     CBC  Recent Labs Lab 03/03/2014 2152 03/15/14 0613 03/15/14 0854 03/16/14 0551  HGB 10.9* 13.6 12.9 10.4*  HCT 33.5* 40.0 38.0 31.5*  WBC 11.7*  --   --  10.3  PLT 190  --   --  129*    COAGULATION  Recent Labs Lab 03/12/2014 2152 03/15/14 0230 03/16/14 0551  INR 1.78* 1.77* 1.67*    CARDIAC    Recent Labs Lab 03/08/2014 2158 03/15/14 1006 03/15/14 1848 03/16/14 0247  TROPONINI <0.30 <0.30 <0.30 <0.30    Recent Labs Lab 03/16/14 0551  PROBNP 3139.0*     CHEMISTRY  Recent Labs Lab 03/15/14 1200 03/15/14 1600 03/15/14 2000 03/16/14 0005 03/16/14 0551  03/16/14 0800  NA 141 139 142 140 141 143  K 4.3 4.2 4.0 3.9 3.7 3.8  CL 98 100 100 99 99 101  CO2 21 21 21 21 21 21   GLUCOSE 135* 147* 155* 161* 166* 175*  BUN 41* 45* 45* 45* 46* 47*  CREATININE 2.18* 2.21* 2.29* 2.32* 2.42* 2.48*  CALCIUM 9.2 9.3 9.3 9.0 9.3 9.0  MG  --  2.1  --   --  2.1  --   PHOS  --  4.9*  --   --  5.2*  --    Estimated Creatinine Clearance: 37.6 ml/min (by C-G formula based on Cr of 2.48).   LIVER  Recent Labs Lab 02/24/2014 2152 03/15/14 0230 03/16/14 0551  AST 57*  --   --   ALT 28  --   --   ALKPHOS 114  --   --   BILITOT 2.5*  --   --   PROT 7.7  --   --   ALBUMIN 3.3*  --   --   INR 1.78* 1.77* 1.67*     INFECTIOUS  Recent Labs Lab 02/26/2014 2207 03/15/14 1007 03/16/14 0552  LATICACIDVEN 6.65* 2.9* 3.2*     ENDOCRINE CBG (last 3)   Recent Labs  03/15/14 2355 03/16/14 0502 03/16/14 0737  GLUCAP 134* 136* 148*         IMAGING x48h  Ct Head Wo Contrast  03/03/2014   CLINICAL DATA:  68 year old female status post CPR. Initial encounter.  EXAM: CT HEAD WITHOUT CONTRAST  TECHNIQUE: Contiguous axial images were obtained from the base of the skull through the vertex without intravenous contrast.  COMPARISON:  02/26/2014.  FINDINGS: Study is mildly degraded by motion artifact despite repeated imaging attempts.  No acute osseous abnormality identified. Continued right mastoid effusion. Mild opacification now the left mastoids. Mild paranasal sinus opacification. Opacification of the pharynx maybe fluid. No acute osseous abnormality identified.  Stable orbit and scalp soft tissues.  Stable cerebral volume. No ventriculomegaly. Stable hypodensity in the left corona radiata tracking toward the external capsule. No midline shift, mass effect, or evidence of intracranial mass lesion. No acute intracranial hemorrhage identified. No evidence of cortically based acute infarction identified. Calcified atherosclerosis at the skull base. Chronic  appearing inferior left cerebellar infarct better demonstrated on prior.  IMPRESSION: Stable CT appearance of the brain with chronic small vessel ischemia in the left hemisphere and left cerebellum. No acute intracranial abnormality identified.   Electronically Signed   By: Augusto Gamble M.D.   On: 02/25/2014 23:19   Dg Chest Port 1 View  03/16/2014   CLINICAL DATA:  Endotracheal tube placement  EXAM: PORTABLE CHEST - 1 VIEW  COMPARISON:  03/15/2014, 02/26/2014  FINDINGS: Endotracheal tube with the tip 1.5 cm above the carina. Recommend retracting the endotracheal tube 1.5 cm.  Right jugular central venous catheter in satisfactory position. There is a nasogastric tube coursing below the diaphragm with the tip excluded from the field of view.  There are bilateral interstitial and patchy alveolar airspace opacities. There is no definite pleural effusion. Stable cardiomegaly.  Unremarkable osseous structures.  IMPRESSION: 1. Endotracheal tube with the tip 1.5 cm above the carina. Recommend retracting the endotracheal tube 1.5 cm. 2. Bilateral interstitial and patchy alveolar airspace opacities. The findings are concerning for pulmonary edema versus multi lobar pneumonia versus ARDS.   Electronically Signed   By: Elige KoHetal  Patel   On: 03/16/2014 07:16   Dg Chest Port 1 View  03/15/2014   CLINICAL DATA:  68 year old female central line placement. Initial encounter.  EXAM: PORTABLE CHEST - 1 VIEW  COMPARISON:  03/27/2014 and earlier.  FINDINGS: Portable AP supine view at 0127 hrs. New left IJ approach central venous catheter, tip projects to the right of midline just above the carina.  ET tube tip in better position, now just below the clavicles. Enteric tube courses to the abdomen, tip not included.  No pneumothorax. Mildly improved lung volumes and decrease left perihilar consolidation. Improved ventilation in the left lung. Patchy opacity in the right lung not significantly changed.  Stable cardiac size and mediastinal  contours.  IMPRESSION: 1. Left IJ central line placed, tip at the level of the SVC. No pneumothorax. 2. Improved ventilation the left lung. 3. Enteric tube placed, courses to the abdomen. ET tube in good position.   Electronically Signed   By: Augusto GambleLee  Hall M.D.   On: 03/15/2014 02:11   Dg Chest Portable 1 View  01/20/14   CLINICAL DATA:  68 year old female cardiac arrest. Initial encounter.  EXAM: PORTABLE CHEST - 1 VIEW  COMPARISON:  02/26/2014.  FINDINGS: Portable AP supine view at 2209 hrs. Intubated. Endotracheal tube tip at the carina.  Left perihilar consolidation with air bronchograms. No pneumothorax. Perhaps mild patchy right upper lobe opacity. No overt pulmonary edema. No effusion identified. Stable cardiomegaly and mediastinal contours. No acute osseous abnormality identified.  IMPRESSION: 1. Endotracheal tube tip at the carina. Retract 2 cm for more optimal placement. 2. Left upper lobe and perihilar consolidation with air bronchograms. Patchy right upper lobe opacity.   Electronically Signed   By: Augusto GambleLee  Hall M.D.   On: 03/27/2014 22:31      ASSESSMENT / PLAN:  PULMONARY A: 1) Acute hypoxemic respiratory failure post cardiac arrest. 2) Questionable aspiration  3) History of COPD   - does not meet sbt criteria, ET Tube too close to carina P:   - retract et tube 1.5cm - Mechanical ventilation:  - PRVC, Vt: 8cc/kg, PEEP: 5, RR: 16, FiO2: 100% and adjust to keep O2 sat > 94%   - Duonebs q 4 hrs   CARDIOVASCULAR A:  1) Cardiac arrest, unclear etiology, differential includes primary cardiac event, hypercarbic failure, PE 2) Systolic heart failure, bedside echo done showed LVEF of 10 to 15 % with severely dilated RV.   -  QTc prolonged 03/15/14; not known to be on any drugs that do that. Could be cause of arrest. On low dose pressors   P:  - fluid bolus and try to wean pressors off - Hypothermia protocol rewarm since 6 am 03/16/14 - - Echocardiogram - Cardiology consult  appreciated  RENAL A:   1) Acute renal failure, likely pre renal - resolved but persistent lactic acidosis  P:   - 500cc salin bolus to enhance bp and lactate clearance - IVF resuscitation - Will follow chemistry   GASTROINTESTINAL A:   1) No issues P:   - GI prophylaxis with protonix  HEMATOLOGIC A:   1) AT admit hx: Questionable history of vaginal bleed vs urinary tract bleed. Unable to determine. Urine is bloody tinged. On exam no evidence of significant bleeding.    - no obvious bleed  P:  - DVT prophylaxis with SQ heparin - Will follow CBC: prbc for hgb < 8gm% in view of cardiac arrest  INFECTIOUS A:   1) Bilateral lung infiltrates. Questionable aspiration pneumonia 2) UTI P:   - Zosyn - Will follow cultures  ENDOCRINE A:   1) DM on insulin 2) Hypoglycemic at admission. Has gotten 2 amps of D50 P:   - Hypoglycemia protocol  - Novolog sliding scale  NEUROLOGIC A:   1) Post cardiac arrest. Likely anoxic brain injury 2) CT head with no intracranial bleed.   - being rewarmed P:   - Sedation and paralysis per hypothermia protocol  - Dc diprivan if lactic acidosis persists; reassess 03/17/14   GLOBAL  - 03/16/14: no family at bedside but brother in waiting room. He will need goals of care discussion next 24-72h    The patient is critically ill with multiple organ systems failure and requires high complexity decision making for assessment and support, frequent evaluation and titration of therapies, application of advanced monitoring technologies and extensive interpretation of multiple databases.   Critical Care Time devoted to patient care services described in this note is  35  Minutes.  Dr. Kalman Shan, M.D., [facility].C.P Pulmonary and Critical Care Medicine Staff Physician [place] [facility] [place] [facility] Pager: [phone], If no answer or between  15:00h - 7:00h: call [phone]  03/16/2014 9:14 AM

## 2014-03-16 NOTE — Progress Notes (Signed)
VASCULAR LAB PRELIMINARY  PRELIMINARY  PRELIMINARY  PRELIMINARY  Bilateral lower extremity venous Dopplers completed.    Preliminary report:  There is no obvious evidence of DVT or SVT noted in the right lower extremity.  Unable to adequately visualize left thigh and calf secondary to body habitus, positioning, and arctic sun.  There is no obvious evidence of DVT or SVT ntoed in the left popliteal veins.  Kern Alberta, RVT 03/16/2014, 12:20 PM

## 2014-03-16 NOTE — Progress Notes (Signed)
CRITICAL VALUE ALERT  Critical value received:  Positivelblood culture- gram + cocci  Date of notification:  03/16/14  Time of notification:  1000  Critical value read back:yes  Nurse who received alert:  P. Mikle Bosworth RN  MD notified (1st page):  Dr. Marchelle Gearing  Time of first page:  1000  MD notified (2nd page):  Time of second page:  Responding MD:  Dr. Marchelle Gearing  Time MD responded:  1000

## 2014-03-16 NOTE — Progress Notes (Signed)
Echocardiogram 2D Echocardiogram has been performed.  Martha Gross 03/16/2014, 11:07 AM

## 2014-03-16 NOTE — Progress Notes (Addendum)
Patient ID: Martha SierrasMary Cockerell, female   DOB: 06/21/1946, 68 y.o.   MRN: [id number]    SUBJECTIVE: Patient is intubated, sedated, and cooled.  Has required a low dose of norepinephrine.   Scheduled Meds: . albuterol      . antiseptic oral rinse  15 mL Mouth Rinse QID  . artificial tears  1 application Both Eyes 3 times per day  . chlorhexidine  15 mL Mouth Rinse BID  . Chlorhexidine Gluconate Cloth  6 each Topical Q0600  . heparin  5,000 Units Subcutaneous 3 times per day  . insulin aspart  2-6 Units Subcutaneous 6 times per day  . ipratropium-albuterol  3 mL Nebulization Q4H  . mupirocin ointment  1 application Nasal BID  . pantoprazole (PROTONIX) IV  40 mg Intravenous Daily  . piperacillin-tazobactam (ZOSYN)  IV  3.375 g Intravenous 3 times per day  . THROMBI-PAD  1 each Topical Once   Continuous Infusions: . sodium chloride 10 mL/hr at 03/15/14 0830  . cisatracurium (NIMBEX) infusion 1.5 mcg/kg/min (03/16/14 0600)  . fentaNYL infusion INTRAVENOUS 100 mcg/hr (03/16/14 0600)  . midazolam (VERSED) infusion    . norepinephrine (LEVOPHED) Adult infusion 2 mcg/min (03/16/14 0245)  . propofol 20 mcg/kg/min (03/16/14 0600)   PRN Meds:.fentaNYL    Filed Vitals:   03/16/14 0600 03/16/14 0700 03/16/14 0732 03/16/14 0733  BP:    126/57  Pulse:    69  Temp: 91.4 F (33 C) 91.8 F (33.2 C)    TempSrc: Core (Comment) Core (Comment)  Core (Comment)  Resp:      Height:      Weight:      SpO2:   100%     Intake/Output Summary (Last 24 hours) at 03/16/14 0739 Last data filed at 03/16/14 0700  Gross per 24 hour  Intake 1601.9 ml  Output    585 ml  Net 1016.9 ml    LABS: Basic Metabolic Panel:  Recent Labs  40/98/1105/30/15 1600  03/16/14 0005 03/16/14 0551  NA 139  < > 140 141  K 4.2  < > 3.9 3.7  CL 100  < > 99 99  CO2 21  < > 21 21  GLUCOSE 147*  < > 161* 166*  BUN 45*  < > 45* 46*  CREATININE 2.21*  < > 2.32* 2.42*  CALCIUM 9.3  < > 9.0 9.3  MG 2.1  --   --  2.1  PHOS 4.9*  --    --  5.2*  < > = values in this interval not displayed. Liver Function Tests:  Recent Labs  03/11/2014 2152  AST 57*  ALT 28  ALKPHOS 114  BILITOT 2.5*  PROT 7.7  ALBUMIN 3.3*   No results found for this basename: LIPASE, AMYLASE,  in the last 72 hours CBC:  Recent Labs  03/11/2014 2152  03/15/14 0854 03/16/14 0551  WBC 11.7*  --   --  10.3  NEUTROABS 10.4*  --   --  8.7*  HGB 10.9*  < > 12.9 10.4*  HCT 33.5*  < > 38.0 31.5*  MCV 82.1  --   --  79.5  PLT 190  --   --  129*  < > = values in this interval not displayed. Cardiac Enzymes:  Recent Labs  03/15/14 1006 03/15/14 1848 03/16/14 0247  TROPONINI <0.30 <0.30 <0.30   BNP: No components found with this basename: POCBNP,  D-Dimer: No results found for this basename: DDIMER,  in the last 72  hours Hemoglobin A1C: No results found for this basename: HGBA1C,  in the last 72 hours Fasting Lipid Panel:  Recent Labs  03/16/14 0551  TRIG 163*   Thyroid Function Tests: No results found for this basename: TSH, T4TOTAL, FREET3, T3FREE, THYROIDAB,  in the last 72 hours Anemia Panel: No results found for this basename: VITAMINB12, FOLATE, FERRITIN, TIBC, IRON, RETICCTPCT,  in the last 72 hours  RADIOLOGY: Ct Head Wo Contrast  02/26/2014   CLINICAL DATA:  68 year old female status post CPR. Initial encounter.  EXAM: CT HEAD WITHOUT CONTRAST  TECHNIQUE: Contiguous axial images were obtained from the base of the skull through the vertex without intravenous contrast.  COMPARISON:  02/26/2014.  FINDINGS: Study is mildly degraded by motion artifact despite repeated imaging attempts.  No acute osseous abnormality identified. Continued right mastoid effusion. Mild opacification now the left mastoids. Mild paranasal sinus opacification. Opacification of the pharynx maybe fluid. No acute osseous abnormality identified.  Stable orbit and scalp soft tissues.  Stable cerebral volume. No ventriculomegaly. Stable hypodensity in the left  corona radiata tracking toward the external capsule. No midline shift, mass effect, or evidence of intracranial mass lesion. No acute intracranial hemorrhage identified. No evidence of cortically based acute infarction identified. Calcified atherosclerosis at the skull base. Chronic appearing inferior left cerebellar infarct better demonstrated on prior.  IMPRESSION: Stable CT appearance of the brain with chronic small vessel ischemia in the left hemisphere and left cerebellum. No acute intracranial abnormality identified.   Electronically Signed   By: Augusto Gamble M.D.   On: 02/27/2014 23:19   Dg Chest Port 1 View  03/16/2014   CLINICAL DATA:  Endotracheal tube placement  EXAM: PORTABLE CHEST - 1 VIEW  COMPARISON:  03/15/2014, 02/26/2014  FINDINGS: Endotracheal tube with the tip 1.5 cm above the carina. Recommend retracting the endotracheal tube 1.5 cm.  Right jugular central venous catheter in satisfactory position. There is a nasogastric tube coursing below the diaphragm with the tip excluded from the field of view.  There are bilateral interstitial and patchy alveolar airspace opacities. There is no definite pleural effusion. Stable cardiomegaly.  Unremarkable osseous structures.  IMPRESSION: 1. Endotracheal tube with the tip 1.5 cm above the carina. Recommend retracting the endotracheal tube 1.5 cm. 2. Bilateral interstitial and patchy alveolar airspace opacities. The findings are concerning for pulmonary edema versus multi lobar pneumonia versus ARDS.   Electronically Signed   By: Elige Ko   On: 03/16/2014 07:16   Dg Chest Port 1 View  03/15/2014   CLINICAL DATA:  69 year old female central line placement. Initial encounter.  EXAM: PORTABLE CHEST - 1 VIEW  COMPARISON:  03/15/2014 and earlier.  FINDINGS: Portable AP supine view at 0127 hrs. New left IJ approach central venous catheter, tip projects to the right of midline just above the carina.  ET tube tip in better position, now just below the  clavicles. Enteric tube courses to the abdomen, tip not included.  No pneumothorax. Mildly improved lung volumes and decrease left perihilar consolidation. Improved ventilation in the left lung. Patchy opacity in the right lung not significantly changed.  Stable cardiac size and mediastinal contours.  IMPRESSION: 1. Left IJ central line placed, tip at the level of the SVC. No pneumothorax. 2. Improved ventilation the left lung. 3. Enteric tube placed, courses to the abdomen. ET tube in good position.   Electronically Signed   By: Augusto Gamble M.D.   On: 03/15/2014 02:11   Dg Chest Portable 1 View  04-05-14   CLINICAL DATA:  68 year old female cardiac arrest. Initial encounter.  EXAM: PORTABLE CHEST - 1 VIEW  COMPARISON:  02/26/2014.  FINDINGS: Portable AP supine view at 2209 hrs. Intubated. Endotracheal tube tip at the carina.  Left perihilar consolidation with air bronchograms. No pneumothorax. Perhaps mild patchy right upper lobe opacity. No overt pulmonary edema. No effusion identified. Stable cardiomegaly and mediastinal contours. No acute osseous abnormality identified.  IMPRESSION: 1. Endotracheal tube tip at the carina. Retract 2 cm for more optimal placement. 2. Left upper lobe and perihilar consolidation with air bronchograms. Patchy right upper lobe opacity.   Electronically Signed   By: Augusto Gamble M.D.   On: 04/05/14 22:31    PHYSICAL EXAM General: intubated/sedated, obese Neck: Thick, JVP difficult, no thyromegaly or thyroid nodule.  Lungs: Clear to auscultation bilaterally with normal respiratory effort. CV: Nondisplaced PMI.  Heart regular S1/S2, no S3/S4, no murmur.  No peripheral edema.   Abdomen: Soft, no hepatosplenomegaly, no distention.  Neurologic: intubated/sedated  Extremities: No clubbing or cyanosis.   TELEMETRY: Reviewed telemetry pt in NSR  ASSESSMENT AND PLAN: 68 yo with history of obesity, DM, CHF, prior CVA had asystole with prolonged down-time prior to ROSC.  Possible  aspiration as initiating event.  She is now cooled and intubated/sedated.  I am concerned that she will not recover well given prolonged down-time.  Bedside echo by Dr. Marchelle Gearing suggested EF severely depressed.  AKI also noted.  - Will arrange for formal echocardiogram.  - If she recovers neurologic function, will need cardiac workup/cath.   Laurey Morale 03/16/2014 7:44 AM

## 2014-03-17 ENCOUNTER — Inpatient Hospital Stay (HOSPITAL_COMMUNITY): Payer: Medicare Other

## 2014-03-17 DIAGNOSIS — G9340 Encephalopathy, unspecified: Secondary | ICD-10-CM | POA: Diagnosis present

## 2014-03-17 DIAGNOSIS — I509 Heart failure, unspecified: Secondary | ICD-10-CM

## 2014-03-17 LAB — BASIC METABOLIC PANEL
BUN: 49 mg/dL — AB (ref 6–23)
BUN: 50 mg/dL — ABNORMAL HIGH (ref 6–23)
CALCIUM: 9.1 mg/dL (ref 8.4–10.5)
CHLORIDE: 101 meq/L (ref 96–112)
CO2: 23 mEq/L (ref 19–32)
CO2: 24 meq/L (ref 19–32)
CREATININE: 2.83 mg/dL — AB (ref 0.50–1.10)
Calcium: 9.1 mg/dL (ref 8.4–10.5)
Chloride: 101 mEq/L (ref 96–112)
Creatinine, Ser: 2.92 mg/dL — ABNORMAL HIGH (ref 0.50–1.10)
GFR calc Af Amer: 18 mL/min — ABNORMAL LOW (ref >90)
GFR calc Af Amer: 19 mL/min — ABNORMAL LOW (ref >90)
GFR calc non Af Amer: 16 mL/min — ABNORMAL LOW (ref >90)
GFR, EST NON AFRICAN AMERICAN: 16 mL/min — AB (ref >90)
GLUCOSE: 154 mg/dL — AB (ref 70–99)
Glucose, Bld: 147 mg/dL — ABNORMAL HIGH (ref 70–99)
POTASSIUM: 4.1 meq/L (ref 3.7–5.3)
Potassium: 3.8 mEq/L (ref 3.7–5.3)
SODIUM: 142 meq/L (ref 137–147)
Sodium: 142 mEq/L (ref 137–147)

## 2014-03-17 LAB — URINE CULTURE

## 2014-03-17 LAB — POCT I-STAT 3, ART BLOOD GAS (G3+)
ACID-BASE EXCESS: 1 mmol/L (ref 0.0–2.0)
BICARBONATE: 27 meq/L — AB (ref 20.0–24.0)
O2 SAT: 93 %
PH ART: 7.385 (ref 7.350–7.450)
TCO2: 28 mmol/L (ref 0–100)
pCO2 arterial: 45.1 mmHg — ABNORMAL HIGH (ref 35.0–45.0)
pO2, Arterial: 69 mmHg — ABNORMAL LOW (ref 80.0–100.0)

## 2014-03-17 LAB — GLUCOSE, CAPILLARY
GLUCOSE-CAPILLARY: 116 mg/dL — AB (ref 70–99)
GLUCOSE-CAPILLARY: 128 mg/dL — AB (ref 70–99)
GLUCOSE-CAPILLARY: 171 mg/dL — AB (ref 70–99)
Glucose-Capillary: 101 mg/dL — ABNORMAL HIGH (ref 70–99)
Glucose-Capillary: 116 mg/dL — ABNORMAL HIGH (ref 70–99)
Glucose-Capillary: 145 mg/dL — ABNORMAL HIGH (ref 70–99)
Glucose-Capillary: 100 mg/dL — ABNORMAL HIGH (ref 70–99)
Glucose-Capillary: 149 mg/dL — ABNORMAL HIGH (ref 70–99)

## 2014-03-17 LAB — CBC WITH DIFFERENTIAL/PLATELET
Basophils Absolute: 0 10*3/uL (ref 0.0–0.1)
Basophils Relative: 0 % (ref 0–1)
EOS PCT: 0 % (ref 0–5)
Eosinophils Absolute: 0 10*3/uL (ref 0.0–0.7)
HEMATOCRIT: 33.2 % — AB (ref 36.0–46.0)
Hemoglobin: 10.9 g/dL — ABNORMAL LOW (ref 12.0–15.0)
LYMPHS PCT: 8 % — AB (ref 12–46)
Lymphs Abs: 1.1 10*3/uL (ref 0.7–4.0)
MCH: 26.1 pg (ref 26.0–34.0)
MCHC: 32.8 g/dL (ref 30.0–36.0)
MCV: 79.4 fL (ref 78.0–100.0)
MONO ABS: 1.6 10*3/uL — AB (ref 0.1–1.0)
Monocytes Relative: 13 % — ABNORMAL HIGH (ref 3–12)
Neutro Abs: 10.2 10*3/uL — ABNORMAL HIGH (ref 1.7–7.7)
Neutrophils Relative %: 79 % — ABNORMAL HIGH (ref 43–77)
Platelets: 140 10*3/uL — ABNORMAL LOW (ref 150–400)
RBC: 4.18 MIL/uL (ref 3.87–5.11)
RDW: 19.8 % — ABNORMAL HIGH (ref 11.5–15.5)
WBC: 13 10*3/uL — AB (ref 4.0–10.5)

## 2014-03-17 LAB — PHOSPHORUS: Phosphorus: 5.4 mg/dL — ABNORMAL HIGH (ref 2.3–4.6)

## 2014-03-17 LAB — CULTURE, BLOOD (ROUTINE X 2)

## 2014-03-17 LAB — LACTIC ACID, PLASMA: LACTIC ACID, VENOUS: 2.1 mmol/L (ref 0.5–2.2)

## 2014-03-17 LAB — MAGNESIUM: Magnesium: 2.2 mg/dL (ref 1.5–2.5)

## 2014-03-17 LAB — TSH: TSH: 1.97 u[IU]/mL (ref 0.350–4.500)

## 2014-03-17 MED ORDER — PRO-STAT SUGAR FREE PO LIQD
60.0000 mL | Freq: Two times a day (BID) | ORAL | Status: DC
  Administered 2014-03-17 – 2014-03-21 (×7): 60 mL
  Filled 2014-03-17 (×10): qty 60

## 2014-03-17 MED ORDER — CEFTRIAXONE SODIUM 1 G IJ SOLR
1.0000 g | INTRAMUSCULAR | Status: DC
  Administered 2014-03-17 – 2014-03-19 (×3): 1 g via INTRAVENOUS
  Filled 2014-03-17 (×3): qty 10

## 2014-03-17 MED ORDER — FUROSEMIDE 10 MG/ML IJ SOLN
40.0000 mg | Freq: Once | INTRAMUSCULAR | Status: AC
  Administered 2014-03-17: 40 mg via INTRAVENOUS
  Filled 2014-03-17: qty 4

## 2014-03-17 MED ORDER — VITAL HIGH PROTEIN PO LIQD
1000.0000 mL | ORAL | Status: DC
  Filled 2014-03-17 (×2): qty 1000

## 2014-03-17 MED ORDER — VITAL HIGH PROTEIN PO LIQD
1000.0000 mL | ORAL | Status: DC
  Administered 2014-03-17 – 2014-03-18 (×2): 1000 mL
  Administered 2014-03-19: 10:00:00
  Administered 2014-03-19: 1000 mL
  Filled 2014-03-17 (×5): qty 1000

## 2014-03-17 NOTE — Progress Notes (Signed)
Patients peak pressures 49, plateau 40 due to weight of patients chest wall. MD aware and changes made to vent to decrease VT from 500 to 450 and to increase rate from 26 to 30.   Post vent changes, peak pressures 34 and plateau 25.  Will continue to monitor.

## 2014-03-17 NOTE — Progress Notes (Signed)
Chaplain encountered pt's daughter in [facility] waiting area, observed she was tearful and offered emotional support. She said that she had received hard news that "they don't think her mother will wake up" and she has to make decisions about "whether to keep her on the machine." She said she and her uncle were planning to discuss today. Chaplain provided emotional support through empathic listening, pastoral presence, and prayer. She is aware of chaplain services and availability. Please page for additional support.   Maurene Capes [phone]

## 2014-03-17 NOTE — Progress Notes (Signed)
Patient Name: Martha Gross Date of Encounter: 03/17/2014  Active Problems:   Cardiac arrest   Acute respiratory failure with hypoxia   Acute on chronic renal failure   Coma   Length of Stay: 3  SUBJECTIVE  Unresponsive to verbal or pain stimuli. Rhythm appears to be accelerated junctional, on low dose norepi for BP support. Echo shows normal LVEF with diastolic dysfunction. Dominant echo abnormalities are right heart enlargement, RV dysfunction, moderate pulmonary HTN and elevated filling pressures. No DVT identified. Blodd cultures positive for Gram +ve cocci.  CURRENT MEDS . antiseptic oral rinse  15 mL Mouth Rinse QID  . chlorhexidine  15 mL Mouth Rinse BID  . Chlorhexidine Gluconate Cloth  6 each Topical Q0600  . heparin subcutaneous  5,000 Units Subcutaneous 3 times per day  . insulin aspart  2-6 Units Subcutaneous 6 times per day  . ipratropium-albuterol  3 mL Nebulization Q4H  . mupirocin ointment  1 application Nasal BID  . pantoprazole (PROTONIX) IV  40 mg Intravenous Daily  . piperacillin-tazobactam (ZOSYN)  IV  3.375 g Intravenous 3 times per day  . THROMBI-PAD  1 each Topical Once    OBJECTIVE   Intake/Output Summary (Last 24 hours) at 03/17/14 0759 Last data filed at 03/17/14 0700  Gross per 24 hour  Intake 1900.43 ml  Output    315 ml  Net 1585.43 ml   Filed Weights   Mar 18, 2014 2229 03/15/14 0023 03/17/14 0500  Weight: 400 lb (181.439 kg) 384 lb 0.7 oz (174.2 kg) 384 lb 7.7 oz (174.4 kg)    PHYSICAL EXAM Filed Vitals:   03/17/14 0415 03/17/14 0500 03/17/14 0625 03/17/14 0739  BP: 116/65     Pulse:  93    Temp:      TempSrc:      Resp: 0 20 26   Height:      Weight:  384 lb 7.7 oz (174.4 kg)    SpO2:  96%  100%   General: unresponsive, no movement, on ventilator Head: no evidence of trauma, PERRL, EOMI, no exophtalmos or lid lag, no myxedema, no xanthelasma; normal ears, nose and oropharynx Neck: cannot evaluate jugular venous pulsations or  hepatojugular reflux; brisk carotid pulses without delay and no carotid bruits Chest: clear to auscultation, no signs of consolidation by percussion or palpation, normal fremitus, symmetrical and full respiratory excursions Cardiovascular: cannot locate the apical impulse, regular rhythm, normal first and second heart sounds, no rubs or gallops, no murmur Abdomen: no tenderness or distention, no masses by palpation, no abnormal pulsatility or arterial bruits, normal bowel sounds, no hepatosplenomegaly Extremities: no clubbing, cyanosis or edema; 2+ radial, ulnar and brachial pulses bilaterally; 2+ right femoral, posterior tibial and dorsalis pedis pulses; 2+ left femoral, posterior tibial and dorsalis pedis pulses; no subclavian or femoral bruits Neurological: unresponsive  LABS  CBC  Recent Labs  03/16/14 0551 03/17/14 0400  WBC 10.3 13.0*  NEUTROABS 8.7* 10.2*  HGB 10.4* 10.9*  HCT 31.5* 33.2*  MCV 79.5 79.4  PLT 129* 140*   Basic Metabolic Panel  Recent Labs  03/16/14 0551  03/16/14 2359 03/17/14 0400  NA 141  < > 142 142  K 3.7  < > 3.8 4.1  CL 99  < > 101 101  CO2 21  < > 23 24  GLUCOSE 166*  < > 154* 147*  BUN 46*  < > 49* 50*  CREATININE 2.42*  < > 2.83* 2.92*  CALCIUM 9.3  < > 9.1 9.1  MG 2.1  --   --  2.2  PHOS 5.2*  --   --  5.4*  < > = values in this interval not displayed. Liver Function Tests  Recent Labs  03/02/2014 2152  AST 57*  ALT 28  ALKPHOS 114  BILITOT 2.5*  PROT 7.7  ALBUMIN 3.3*   No results found for this basename: LIPASE, AMYLASE,  in the last 72 hours Cardiac Enzymes  Recent Labs  03/15/14 1006 03/15/14 1848 03/16/14 0247  TROPONINI <0.30 <0.30 <0.30    Recent Labs  03/16/14 0551  TRIG 163*    Radiology Studies Imaging results have been reviewed and Dg Chest Port 1 View  03/17/2014   CLINICAL DATA:  Check endotracheal tube, shortness of breath.  EXAM: PORTABLE CHEST - 1 VIEW  COMPARISON:  03/16/2014.  FINDINGS: Endotracheal  tube is in satisfactory position. Nasogastric tube is followed into the lower esophagus with the tip projecting beyond the inferior margin of the image. Left IJ central line tip projects over the SVC. Heart is enlarged, stable. Lungs are low in volume with moderate diffuse bilateral airspace disease, somewhat asymmetric in the right perihilar region. No definite pleural fluid.  IMPRESSION: Low lung volumes with moderate diffuse bilateral airspace disease, indicative of edema.   Electronically Signed   By: Leanna BattlesMelinda  Blietz M.D.   On: 03/17/2014 07:35   Dg Chest Port 1 View  03/16/2014   CLINICAL DATA:  Endotracheal tube placement  EXAM: PORTABLE CHEST - 1 VIEW  COMPARISON:  03/15/2014, 02/26/2014  FINDINGS: Endotracheal tube with the tip 1.5 cm above the carina. Recommend retracting the endotracheal tube 1.5 cm.  Right jugular central venous catheter in satisfactory position. There is a nasogastric tube coursing below the diaphragm with the tip excluded from the field of view.  There are bilateral interstitial and patchy alveolar airspace opacities. There is no definite pleural effusion. Stable cardiomegaly.  Unremarkable osseous structures.  IMPRESSION: 1. Endotracheal tube with the tip 1.5 cm above the carina. Recommend retracting the endotracheal tube 1.5 cm. 2. Bilateral interstitial and patchy alveolar airspace opacities. The findings are concerning for pulmonary edema versus multi lobar pneumonia versus ARDS.   Electronically Signed   By: Elige KoHetal  Patel   On: 03/16/2014 07:16    TELE Accelerated junctional rhythm  ECG Accelerated junctional rhythm, no ST changes  ASSESSMENT AND PLAN  Prolonged resuscitation and now unresponsive after return to normothermia - prognosis is grim Echo findings are consistent with severe cor pulmonale, but they cannot distinguish whether there is an acute component (i.e. Acute pulmonary embolism) on top of probable chronic hypoventilation syndrome. When BP allows, may  benefit from diuresis. Family conference is appropriate.   Thurmon FairMihai Leotha Westermeyer, MD, [facility] [phone] office [phone] pager 03/17/2014 7:59 AM

## 2014-03-17 NOTE — Progress Notes (Signed)
Routine EEG completed at bedside, results pending. 

## 2014-03-17 NOTE — Progress Notes (Signed)
NUTRITION FOLLOW-UP/CONSULT  DOCUMENTATION CODES Per approved criteria  -Morbid Obesity   INTERVENTION: Initiate Vital High Protein at 25 ml/hr increase by 38ml every 4 hours to goal of 45 ml/hr. Add 60 ml Prostat liquid protein via tube BID. Goal regimen will provide: 1480 kcal, 155 grams protein, 903 ml free water. RD to continue to monitor.   NUTRITION DIAGNOSIS: Inadequate oral intake related to inability to eat as evidenced by NPO. Ongoing.  Goal: Enteral nutrition to provide 60-70% of estimated calorie needs (22-25 kcals/kg ideal body weight) and 100% of estimated protein needs, based on ASPEN guidelines for permissive underfeeding in critically ill obese individuals.  Monitor:  Weights, labs, TF initiation/tolerance, vent status  ASSESSMENT: Martha Gross with hx of DM, HTN, dyslipidemia, CHF, ex smoker, COPD and recent minor ischemic stroke. Presents brought by EMS after sustaining witnessed cardiac arrest at home. Intubated at arrival to the ED. Head CT negative for bleed, cooling protocol started as patient remained unresponsive after arrival. Neurologic prognosis is guarded.  RD consulted to initiate nutrition support.  Patient is currently intubated on ventilator support MV: 13.4 L/min Temp (24hrs), Avg:96.5 F (35.8 C), Min:93.6 F (34.2 C), Max:99 F (37.2 C)  Propofol: now off  Potassium and magnesium WNL Phosphorus elevated at 5.4    Height: Ht Readings from Last 1 Encounters:  2014-03-15 5\' 7"  (1.702 m)    Weight: Wt Readings from Last 1 Encounters:  03/17/14 384 lb 7.7 oz (174.4 kg)  Admit wt 400 lb  BMI:  Body mass index is 60.2 kg/(m^2). Class III extreme obesity   Estimated Nutritional Needs: Kcal: 2579 Permissive underfeeding kcal goal: 1350 - 1525 Protein: 153 grams Fluid: per MD  Skin: generalized edema, abdominal and elbow incision  Diet Order:  NPO    Intake/Output Summary (Last 24 hours) at 03/17/14 0913 Last data filed at 03/17/14 0700  Gross per 24 hour  Intake 1754.23 ml  Output    255 ml  Net 1499.23 ml    Last BM: 5/30 - diarrhea  Labs:   Recent Labs Lab 03/15/14 1600  03/16/14 0551  03/16/14 1607 03/16/14 2359 03/17/14 0400  NA 139  < > 141  < > 140 142 142  K 4.2  < > 3.7  < > 3.7 3.8 4.1  CL 100  < > 99  < > 99 101 101  CO2 21  < > 21  < > 22 23 24   BUN 45*  < > 46*  < > 47* 49* 50*  CREATININE 2.21*  < > 2.42*  < > 2.55* 2.83* 2.92*  CALCIUM 9.3  < > 9.3  < > 9.1 9.1 9.1  MG 2.1  --  2.1  --   --   --  2.2  PHOS 4.9*  --  5.2*  --   --   --  5.4*  GLUCOSE 147*  < > 166*  < > 172* 154* 147*  < > = values in this interval not displayed.  CBG (last 3)   Recent Labs  03/16/14 1149 03/16/14 1652 03/17/14 0826  GLUCAP 163* 161* 101*    Scheduled Meds: . antiseptic oral rinse  15 mL Mouth Rinse QID  . cefTRIAXone (ROCEPHIN)  IV  1 g Intravenous Q24H  . chlorhexidine  15 mL Mouth Rinse BID  . Chlorhexidine Gluconate Cloth  6 each Topical Q0600  . feeding supplement (VITAL HIGH PROTEIN)  1,000 mL Per Tube Q24H  . furosemide  40 mg Intravenous Once  .  heparin subcutaneous  5,000 Units Subcutaneous 3 times per day  . insulin aspart  2-6 Units Subcutaneous 6 times per day  . ipratropium-albuterol  3 mL Nebulization Q4H  . mupirocin ointment  1 application Nasal BID  . pantoprazole (PROTONIX) IV  40 mg Intravenous Daily  . THROMBI-PAD  1 each Topical Once    Continuous Infusions: . sodium chloride 10 mL/hr at 03/15/14 0830  . fentaNYL infusion INTRAVENOUS Stopped (03/16/14 1815)  . midazolam (VERSED) infusion    . norepinephrine (LEVOPHED) Adult infusion 8 mcg/min (03/17/14 0757)    [name] MS, RD, LDN Inpatient Registered Dietitian Pager: [phone] After-hours pager: [phone]

## 2014-03-17 NOTE — Progress Notes (Signed)
[facility]  Patient Status: Active (receiving services up to time of hospitalization)  [facility] is providing the following services: RN, PT, OT and HHA  If patient discharges after hours, please call [phone].   Wynelle Bourgeois 03/17/2014, 10:34 AM

## 2014-03-17 NOTE — H&P (Signed)
PULMONARY / CRITICAL CARE MEDICINE   Name: Bre Pecina MRN: [id number] DOB: [birth year]    ADMISSION DATE:  02/25/2014  PRIMARY SERVICE: PCCM  CHIEF COMPLAINT:   Witnessed cardiac arrest.  BRIEF PATIENT DESCRIPTION:  68 years old morbid obese AA female with PMH relevant for DM, HTN, dyslipidemia, CHF with unknown LVEF, ex smoker, COPD and recent minor ischemic stroke. Presents brought by EMS after sustaining witnessed cardiac arrest at home. Intubated at arrival to the ED. EKG with no evidence of STEMI. Down time unclear. As per the family EMS took a long time to get there, CPR by family member for 30 minutes to an hour, EMS did about 13 minutes and 2 rounds of epi. Initial rhythm was asystole. Patient unresponsive.  SIGNIFICANT EVENTS / STUDIES:  5/30- CT scan of the head with no evidence of intracranial bleed.  5/31- hypothermia 5/31 echo - 60=65%, Pa 55, no per effusions, The interventricular septum was D-shaped, suggesting RV pressure/volume overload.   6/1- poor neuro examination, off all sedation  LINES / TUBES: - CVL 5/3\0>>> - A line 5/30>>>  CULTURES: - Blood cultures 5/30>>>GPC 1/2>>> - Tracheal aspirate culture ordered - Urine culture 5/30>>>E coli  ANTIBIOTICS: - Zosyn 6/1>>> -ceftriaxone 6/1>>>  SUBJECTIVE: poor nueor examination , off sedation  VITAL SIGNS: Temp:  [93.2 F (34 C)-99 F (37.2 C)] 99 F (37.2 C) (06/01 0348) Pulse Rate:  [82-110] 93 (06/01 0500) Resp:  [0-26] 26 (06/01 0625) BP: (95-117)/(50-65) 116/65 mmHg (06/01 0415) SpO2:  [90 %-100 %] 100 % (06/01 0739) Arterial Line BP: (87-139)/(48-84) 123/84 mmHg (06/01 0625) FiO2 (%):  [40 %-50 %] 40 % (06/01 0739) Weight:  [174.4 kg (384 lb 7.7 oz)] 174.4 kg (384 lb 7.7 oz) (06/01 0500) HEMODYNAMICS: CVP:  [16 mmHg-22 mmHg] 21 mmHg VENTILATOR SETTINGS: Vent Mode:  [-] PRVC FiO2 (%):  [40 %-50 %] 40 % Set Rate:  [26 bmp] 26 bmp Vt Set:  [500 mL] 500 mL PEEP:  [5 cmH20] 5 cmH20 Plateau  Pressure:  [24 cmH20-40 cmH20] 40 cmH20 INTAKE / OUTPUT: Intake/Output     05/31 0701 - 06/01 0700 06/01 0701 - 06/02 0700   I.V. (mL/kg) 1300.4 (7.5)    NG/GT     IV Piggyback 600    Total Intake(mL/kg) 1900.4 (10.9)    Urine (mL/kg/hr) 315 (0.1)    Emesis/NG output     Total Output 315     Net +1585.4            PHYSICAL EXAMINATION: General:obese, female, not awake Neuro: per sluggish, moves no ext to pain, has cough, gag HEENT: obese, ett PULM: coarse  CV: s1 s2 rrr distant GI: soft, BS , no r/g Extremities: edema 1 plus    LABS:  PULMONARY  Recent Labs Lab 02/18/2014 2248 03/15/14 0200 03/15/14 0434 03/15/14 0613 03/15/14 0750 03/15/14 0854  PHART 7.282*  --  7.468*  --   --   --   PCO2ART 50.1*  --  33.6*  --   --   --   PO2ART 72.0*  --  157.0*  --   --   --   HCO3 24.2*  --  24.9*  --   --   --   TCO2 26  --  26 23  --  22  O2SAT 94.0 63.3 100.0  --  73.1  --     CBC  Recent Labs Lab 03/12/2014 2152  03/15/14 0854 03/16/14 0551 03/17/14 0400  HGB 10.9*  < >  12.9 10.4* 10.9*  HCT 33.5*  < > 38.0 31.5* 33.2*  WBC 11.7*  --   --  10.3 13.0*  PLT 190  --   --  129* 140*  < > = values in this interval not displayed.  COAGULATION  Recent Labs Lab 02/20/2014 2152 03/15/14 0230 03/16/14 0551  INR 1.78* 1.77* 1.67*    CARDIAC    Recent Labs Lab 03/09/2014 2158 03/15/14 1006 03/15/14 1848 03/16/14 0247  TROPONINI <0.30 <0.30 <0.30 <0.30    Recent Labs Lab 03/16/14 0551  PROBNP 3139.0*     CHEMISTRY  Recent Labs Lab 03/15/14 1200 03/15/14 1600  03/16/14 0551 03/16/14 0800 03/16/14 1200 03/16/14 1607 03/16/14 2359 03/17/14 0400  NA 141 139  < > 141 143 142 140 142 142  K 4.3 4.2  < > 3.7 3.8 3.9 3.7 3.8 4.1  CL 98 100  < > 99 101 100 99 101 101  CO2 21 21  < > 21 21 22 22 23 24   GLUCOSE 135* 147*  < > 166* 175* 182* 172* 154* 147*  BUN 41* 45*  < > 46* 47* 48* 47* 49* 50*  CREATININE 2.18* 2.21*  < > 2.42* 2.48* 2.50* 2.55*  2.83* 2.92*  CALCIUM 9.2 9.3  < > 9.3 9.0 9.2 9.1 9.1 9.1  MG  --  2.1  --  2.1  --   --   --   --  2.2  PHOS  --  4.9*  --  5.2*  --   --   --   --  5.4*  < > = values in this interval not displayed. Estimated Creatinine Clearance: 31.9 ml/min (by C-G formula based on Cr of 2.92).   LIVER  Recent Labs Lab 02/15/2014 2152 03/15/14 0230 03/16/14 0551  AST 57*  --   --   ALT 28  --   --   ALKPHOS 114  --   --   BILITOT 2.5*  --   --   PROT 7.7  --   --   ALBUMIN 3.3*  --   --   INR 1.78* 1.77* 1.67*     INFECTIOUS  Recent Labs Lab 03/15/14 1007 03/16/14 0552 03/17/14 0500  LATICACIDVEN 2.9* 3.2* 2.1     ENDOCRINE CBG (last 3)   Recent Labs  03/16/14 0737 03/16/14 1149 03/16/14 1652  GLUCAP 148* 163* 161*     IMAGING x48h  Dg Chest Port 1 View  03/17/2014   CLINICAL DATA:  Check endotracheal tube, shortness of breath.  EXAM: PORTABLE CHEST - 1 VIEW  COMPARISON:  03/16/2014.  FINDINGS: Endotracheal tube is in satisfactory position. Nasogastric tube is followed into the lower esophagus with the tip projecting beyond the inferior margin of the image. Left IJ central line tip projects over the SVC. Heart is enlarged, stable. Lungs are low in volume with moderate diffuse bilateral airspace disease, somewhat asymmetric in the right perihilar region. No definite pleural fluid.  IMPRESSION: Low lung volumes with moderate diffuse bilateral airspace disease, indicative of edema.   Electronically Signed   By: Lorin Picket M.D.   On: 03/17/2014 07:35   Dg Chest Port 1 View  03/16/2014   CLINICAL DATA:  Endotracheal tube placement  EXAM: PORTABLE CHEST - 1 VIEW  COMPARISON:  03/15/2014, 02/26/2014  FINDINGS: Endotracheal tube with the tip 1.5 cm above the carina. Recommend retracting the endotracheal tube 1.5 cm.  Right jugular central venous catheter in satisfactory position. There is a  nasogastric tube coursing below the diaphragm with the tip excluded from the field of view.   There are bilateral interstitial and patchy alveolar airspace opacities. There is no definite pleural effusion. Stable cardiomegaly.  Unremarkable osseous structures.  IMPRESSION: 1. Endotracheal tube with the tip 1.5 cm above the carina. Recommend retracting the endotracheal tube 1.5 cm. 2. Bilateral interstitial and patchy alveolar airspace opacities. The findings are concerning for pulmonary edema versus multi lobar pneumonia versus ARDS.   Electronically Signed   By: Kathreen Devoid   On: 03/16/2014 07:16    ASSESSMENT / PLAN:  PULMONARY A: 1) Acute hypoxemic respiratory failure post cardiac arrest. 2) Questionable aspiration  3) History of COPD 4) pulm edema  P:   -lasix to neg balance -cpap 5 ps 5-10 attempted, por initiation noted -repeat abg to ensure not alk -pcxr in am for edema follow up  CARDIOVASCULAR A:  1) Cardiac arrest, unclear etiology, differential includes primary cardiac event, hypercarbic failure, PE 3) QTc prolonged P:  -per cards -agree lasix -levophed to MAp 60, goal to off  -cortisol assessment -ensure tsh  RENAL A:   1) Acute renal failure, chronic? P:   - agree lasix, neg 1 liter goal -bmet in am -kvo -may need renal US  GASTROINTESTINAL A:   1) NPO P:   - GI prophylaxis with protonix -start TF  HEMATOLOGIC A:   1) DVt prevention  P:  - DVT prophylaxis with SQ heparin - Will follow CBC -follow official dopplers done  INFECTIOUS A:   1) Bilateral lung infiltrates. Asp vs edema, UTI, r/o contamination BC 2) UTI P:   - Zosyn dc - add ceftriaxone -repeat BC  ENDOCRINE A:   1) DM on insulin 2) Hypoglycemic at admission. Has gotten 2 amps of D50 P:   - SSI -crotisol -tsh  NEUROLOGIC A:   1) Post cardiac arrest. Likely severe anoxic brain injury P:   -maintain off all sedation -eeg -will update family  Ccm time 35 min   Global: eeg, likely poor neuro outcome, start TF, renal US, lasix  Lavon Paganini. Titus Mould, MD,  [place] Pgr: [facility]

## 2014-03-17 NOTE — Clinical Documentation Improvement (Signed)
Documentation Clarification #1:   Possible Clinical Conditions?   Septicemia / Sepsis Severe Sepsis  SIRS Septic Shock Sepsis with UTI Sepsis due to an internal device  Other Condition  Cannot clinically Determine    Labs:  Lactic acid: 5/29: 6.65. 5/30: 2.9. 5.31: 3.2. 6/01: 3.2. ________________________________________________________________________________________________ Documentation Clarification #2:   Possible Clinical Conditions?   Acute Systolic Heart Failure Acute on Chronic Systolic Heart Failure Chronic Systolic Heart Failure Acute Cor Pulmonale Other Condition Cannot clinically determine   Risk Factors: Systolic heart failure noted per 5/30 progress notes. Echo findings consistent with Severe cor pulmonale per 6/01 progress notes.   Labs: 5/31: proBNP: 3139.0 ________________________________________________________________________________________________ Documentation Clarification #3:   Possible Clinical Conditions?   Obesity Hypoventilation Syndrome Other Condition Cannot clinically determin   Risk Factors: Probable chronic hypoventilation syndrome per 6/01 progress notes. BMI 62.8 per Doc Flowsheets.    Thank You, Marciano Sequin, Clinical Documentation Specialist:  [phone]  [facility]- Health Information Management

## 2014-03-17 NOTE — Progress Notes (Signed)
Patient ID: Martha Gross, female   DOB: [birth year], 68 y.o.   MRN: [id number] I have had extensive discussions with family daughter. We discussed patients current circumstances and organ failures. We also discussed patient's prior wishes under circumstances such as this. Family has decided to NOT perform resuscitation if arrest but to continue current medical support for now.  [name]. [name], MD, FACP Pgr: [phone] [facility]

## 2014-03-17 NOTE — Care Management Note (Signed)
    Page 1 of 1   03/17/2014     10:31:19 AM CARE MANAGEMENT NOTE 03/17/2014  Patient:  [facility]   Account Number:  [id number]  Date Initiated:  03/17/2014  Documentation initiated by:  Junius Creamer  Subjective/Objective Assessment:   adm w cardiac arrest-vent     Action/Plan:   lives w fam, act w [facility] for pt pta   Anticipated DC Date:     Anticipated DC Plan:  HOME W HOME HEALTH SERVICES      DC Planning Services  CM consult      Choice offered to / List presented to:          [facility] arranged  HH-2 PT      [facility] agency  [facility]   Status of service:   Medicare Important Message given?   (If response is "NO", the following Medicare IM given date fields will be blank) Date Medicare IM given:   Date Additional Medicare IM given:    Discharge Disposition:    Per UR Regulation:  Reviewed for med. necessity/level of care/duration of stay  If discussed at Long Length of Stay Meetings, dates discussed:    Comments:

## 2014-03-17 NOTE — Procedures (Signed)
ELECTROENCEPHALOGRAM REPORT  Patient: Martha Gross       Room #: 2T94 EEG No. ID: [id number] Age: 68 y.o.        Sex: female Referring Physician: Lavinia Sharps  Report Date:  03/17/2014        Interpreting Physician: Noel Christmas  History: Martha Gross is an 68 y.o. female a history of congestive heart failure, diabetes mellitus, COPD and hypertension who was admitted on 02/15/2014 following cardiac arrest. Patient has undergone cooling protocol and has remained unresponsive.  Indications for study:  Assess severity of encephalopathy; rule out seizure activity.  Technique: This is an 18 channel routine scalp EEG performed at the bedside with bipolar and monopolar montages arranged in accordance to the international 10/20 system of electrode placement.   Description: patient was noted to be unresponsive and intubated and on mechanical ventilation at the time of this study. Background activity consisted of very low amplitude diffuse symmetrical continues 1-2 vertebrae delta activity with occasional occurrences of low amplitude superimposed beta activity. Photic stimulation produced no appreciable occipital driving response. No epileptiform discharges recorded.  Interpretation: this EEG is abnormal with severe generalized continuous nonspecific slowing of cerebral activity, consistent with severe encephalopathy. His been no slowing can be seen with toxic as well as metabolic and toxic encephalopathies. No evidence of an epileptic disorder is demonstrated.   Venetia Maxon M.D. [facility] [phone]

## 2014-03-17 DEATH — deceased

## 2014-03-18 DIAGNOSIS — R4182 Altered mental status, unspecified: Secondary | ICD-10-CM

## 2014-03-18 LAB — CBC WITH DIFFERENTIAL/PLATELET
BASOS ABS: 0 10*3/uL (ref 0.0–0.1)
Basophils Relative: 0 % (ref 0–1)
EOS PCT: 1 % (ref 0–5)
Eosinophils Absolute: 0.1 10*3/uL (ref 0.0–0.7)
HCT: 27.1 % — ABNORMAL LOW (ref 36.0–46.0)
Hemoglobin: 8.8 g/dL — ABNORMAL LOW (ref 12.0–15.0)
LYMPHS PCT: 8 % — AB (ref 12–46)
Lymphs Abs: 0.7 10*3/uL (ref 0.7–4.0)
MCH: 26.1 pg (ref 26.0–34.0)
MCHC: 32.5 g/dL (ref 30.0–36.0)
MCV: 80.4 fL (ref 78.0–100.0)
Monocytes Absolute: 1.3 10*3/uL — ABNORMAL HIGH (ref 0.1–1.0)
Monocytes Relative: 14 % — ABNORMAL HIGH (ref 3–12)
NEUTROS ABS: 6.9 10*3/uL (ref 1.7–7.7)
Neutrophils Relative %: 77 % (ref 43–77)
PLATELETS: 96 10*3/uL — AB (ref 150–400)
RBC: 3.37 MIL/uL — ABNORMAL LOW (ref 3.87–5.11)
RDW: 20 % — ABNORMAL HIGH (ref 11.5–15.5)
WBC: 9.1 10*3/uL (ref 4.0–10.5)

## 2014-03-18 LAB — GLUCOSE, CAPILLARY
Glucose-Capillary: 117 mg/dL — ABNORMAL HIGH (ref 70–99)
Glucose-Capillary: 131 mg/dL — ABNORMAL HIGH (ref 70–99)
Glucose-Capillary: 132 mg/dL — ABNORMAL HIGH (ref 70–99)
Glucose-Capillary: 148 mg/dL — ABNORMAL HIGH (ref 70–99)
Glucose-Capillary: 156 mg/dL — ABNORMAL HIGH (ref 70–99)
Glucose-Capillary: 160 mg/dL — ABNORMAL HIGH (ref 70–99)

## 2014-03-18 LAB — BLOOD GAS, ARTERIAL
Acid-Base Excess: 1 mmol/L (ref 0.0–2.0)
BICARBONATE: 25.5 meq/L — AB (ref 20.0–24.0)
Drawn by: 252031
FIO2: 0.4 %
MECHVT: 450 mL
O2 Saturation: 98.7 %
PEEP: 5 cmH2O
PH ART: 7.38 (ref 7.350–7.450)
Patient temperature: 98.6
RATE: 30 resp/min
TCO2: 26.9 mmol/L (ref 0–100)
pCO2 arterial: 44.1 mmHg (ref 35.0–45.0)
pO2, Arterial: 94 mmHg (ref 80.0–100.0)

## 2014-03-18 LAB — PHOSPHORUS: PHOSPHORUS: 5.7 mg/dL — AB (ref 2.3–4.6)

## 2014-03-18 LAB — BASIC METABOLIC PANEL
BUN: 56 mg/dL — ABNORMAL HIGH (ref 6–23)
CALCIUM: 8.6 mg/dL (ref 8.4–10.5)
CO2: 24 meq/L (ref 19–32)
Chloride: 102 mEq/L (ref 96–112)
Creatinine, Ser: 3.36 mg/dL — ABNORMAL HIGH (ref 0.50–1.10)
GFR calc Af Amer: 15 mL/min — ABNORMAL LOW (ref >90)
GFR calc non Af Amer: 13 mL/min — ABNORMAL LOW (ref >90)
Glucose, Bld: 159 mg/dL — ABNORMAL HIGH (ref 70–99)
POTASSIUM: 4.2 meq/L (ref 3.7–5.3)
SODIUM: 142 meq/L (ref 137–147)

## 2014-03-18 LAB — MAGNESIUM: MAGNESIUM: 2.1 mg/dL (ref 1.5–2.5)

## 2014-03-18 LAB — CORTISOL: Cortisol, Plasma: 23.5 ug/dL

## 2014-03-18 MED ORDER — PROPOFOL 10 MG/ML IV EMUL
INTRAVENOUS | Status: AC
  Filled 2014-03-18: qty 100

## 2014-03-18 MED ORDER — FENTANYL CITRATE 0.05 MG/ML IJ SOLN
25.0000 ug | INTRAMUSCULAR | Status: DC | PRN
  Administered 2014-03-18: 50 ug via INTRAVENOUS
  Administered 2014-03-21: 100 ug via INTRAVENOUS
  Filled 2014-03-18: qty 2

## 2014-03-18 MED ORDER — PROPOFOL 10 MG/ML IV EMUL
5.0000 ug/kg/min | INTRAVENOUS | Status: DC
  Administered 2014-03-18: 20 ug/kg/min via INTRAVENOUS
  Administered 2014-03-19 (×2): 10 ug/kg/min via INTRAVENOUS
  Administered 2014-03-20: 20 ug/kg/min via INTRAVENOUS
  Administered 2014-03-20 (×4): 30 ug/kg/min via INTRAVENOUS
  Administered 2014-03-20 (×2): 20 ug/kg/min via INTRAVENOUS
  Administered 2014-03-21 (×4): 30 ug/kg/min via INTRAVENOUS
  Filled 2014-03-18 (×6): qty 100
  Filled 2014-03-18: qty 200
  Filled 2014-03-18 (×5): qty 100

## 2014-03-18 MED ORDER — FENTANYL CITRATE 0.05 MG/ML IJ SOLN
INTRAMUSCULAR | Status: AC
  Filled 2014-03-18: qty 2

## 2014-03-18 NOTE — Progress Notes (Signed)
Patient Name: Martha SierrasMary Gross Date of Encounter: 03/18/2014  Active Problems:   Cardiac arrest   Acute respiratory failure with hypoxia   Acute on chronic renal failure   Coma   Acute encephalopathy   Length of Stay: 4  SUBJECTIVE  Unresponsive. Norepi IV at 4 mcg/kg/min, low normal BP Acelerated junctional rhythm  CURRENT MEDS . antiseptic oral rinse  15 mL Mouth Rinse QID  . cefTRIAXone (ROCEPHIN)  IV  1 g Intravenous Q24H  . chlorhexidine  15 mL Mouth Rinse BID  . Chlorhexidine Gluconate Cloth  6 each Topical Q0600  . feeding supplement (PRO-STAT SUGAR FREE 64)  60 mL Per Tube BID  . feeding supplement (VITAL HIGH PROTEIN)  1,000 mL Per Tube Q24H  . heparin subcutaneous  5,000 Units Subcutaneous 3 times per day  . insulin aspart  2-6 Units Subcutaneous 6 times per day  . ipratropium-albuterol  3 mL Nebulization Q4H  . mupirocin ointment  1 application Nasal BID  . pantoprazole (PROTONIX) IV  40 mg Intravenous Daily  . THROMBI-PAD  1 each Topical Once    OBJECTIVE   Intake/Output Summary (Last 24 hours) at 03/18/14 0759 Last data filed at 03/18/14 0600  Gross per 24 hour  Intake 1161.68 ml  Output    465 ml  Net 696.68 ml   Filed Weights   03/15/14 0023 03/17/14 0500 03/18/14 0247  Weight: 384 lb 0.7 oz (174.2 kg) 384 lb 7.7 oz (174.4 kg) 338 lb (153.316 kg)    PHYSICAL EXAM Filed Vitals:   03/18/14 0515 03/18/14 0530 03/18/14 0600 03/18/14 0727  BP: 136/70 127/79 116/42   Pulse: 87 94 92   Temp:      TempSrc:      Resp: 30 30 0   Height:      Weight:      SpO2:  87% 91% 92%   General: unresponsive, no movement, on ventilator  Head: no evidence of trauma, PERRL, EOMI, no exophtalmos or lid lag, no myxedema, no xanthelasma; normal ears, nose and oropharynx  Neck: cannot evaluate jugular venous pulsations or hepatojugular reflux; brisk carotid pulses without delay and no carotid bruits  Chest: clear to auscultation, no signs of consolidation by percussion or  palpation, normal fremitus, symmetrical and full respiratory excursions  Cardiovascular: cannot locate the apical impulse, regular rhythm, normal first and second heart sounds, no rubs or gallops, no murmur  Abdomen: no tenderness or distention, no masses by palpation, no abnormal pulsatility or arterial bruits, normal bowel sounds, no hepatosplenomegaly  Extremities: no clubbing, cyanosis or edema; 2+ radial, ulnar and brachial pulses bilaterally; 2+ right femoral, posterior tibial and dorsalis pedis pulses; 2+ left femoral, posterior tibial and dorsalis pedis pulses; no subclavian or femoral bruits  Neurological: unresponsive   LABS  CBC  Recent Labs  03/17/14 0400 03/18/14 0400  WBC 13.0* 9.1  NEUTROABS 10.2* 6.9  HGB 10.9* 8.8*  HCT 33.2* 27.1*  MCV 79.4 80.4  PLT 140* 96*   Basic Metabolic Panel  Recent Labs  03/17/14 0400 03/18/14 0400  NA 142 142  K 4.1 4.2  CL 101 102  CO2 24 24  GLUCOSE 147* 159*  BUN 50* 56*  CREATININE 2.92* 3.36*  CALCIUM 9.1 8.6  MG 2.2 2.1  PHOS 5.4* 5.7*  Cardiac Enzymes  Recent Labs  03/15/14 1006 03/15/14 1848 03/16/14 0247  TROPONINI <0.30 <0.30 <0.30   Fasting Lipid Panel  Recent Labs  03/16/14 0551  TRIG 163*   Thyroid Function Tests  Recent Labs  03/17/14 1025  TSH 1.970    Radiology Studies Imaging results have been reviewed and US Renal Port  03/17/2014   CLINICAL DATA:  Renal failure, elevated BUN and creatinine  EXAM: RENAL/URINARY TRACT ULTRASOUND COMPLETE  COMPARISON:  02/27/2014  FINDINGS: Right Kidney:  Length: 12.2 cm. Echogenicity within normal limits. No mass or hydronephrosis visualized.  Left Kidney:  Length: 12.3 cm. Echogenicity within normal limits. No mass or hydronephrosis visualized.  Bladder:  Decompressed by Foley catheter.  Not visualized.  IMPRESSION: Normal renal ultrasound for age.   Electronically Signed   By: Ruel Favors M.D.   On: 03/17/2014 11:41   Dg Chest Port 1 View  03/17/2014    CLINICAL DATA:  Check endotracheal tube, shortness of breath.  EXAM: PORTABLE CHEST - 1 VIEW  COMPARISON:  03/16/2014.  FINDINGS: Endotracheal tube is in satisfactory position. Nasogastric tube is followed into the lower esophagus with the tip projecting beyond the inferior margin of the image. Left IJ central line tip projects over the SVC. Heart is enlarged, stable. Lungs are low in volume with moderate diffuse bilateral airspace disease, somewhat asymmetric in the right perihilar region. No definite pleural fluid.  IMPRESSION: Low lung volumes with moderate diffuse bilateral airspace disease, indicative of edema.   Electronically Signed   By: Leanna Battles M.D.   On: 03/17/2014 07:35    TELE AJR  ASSESSMENT AND PLAN Multiple poor prognostic features. Odd of meaningful functional recovery are virtually zero. Withdrawal of aggressive life support appears appropriate. Will sign off. Please reconsult if needed.   Thurmon Fair, MD, [facility] [phone] office [phone] pager 03/18/2014 7:59 AM

## 2014-03-18 NOTE — Progress Notes (Signed)
eLink Physician-Brief Progress Note Patient Name: Martha Gross DOB: Jan 28, 1946 MRN: [id number]  Date of Service  03/18/2014   HPI/Events of Note  Agitation, biting on tube, vent dyssynchrony   eICU Interventions  Propofol and PRN fentanyl ordered   Intervention Category Major Interventions: Delirium, psychosis, severe agitation - evaluation and management  Merwyn Katos 03/18/2014, 10:34 PM

## 2014-03-18 NOTE — Progress Notes (Signed)
Decreased to 70% and increase peep to 8 per MD

## 2014-03-18 NOTE — H&P (Addendum)
PULMONARY / CRITICAL CARE MEDICINE   Name: Martha Gross MRN: [id number] DOB: 07-16-1946    ADMISSION DATE:  03/04/2014  PRIMARY SERVICE: PCCM  CHIEF COMPLAINT:   Witnessed cardiac arrest.  BRIEF PATIENT DESCRIPTION:  68 years old morbid obese AA female with PMH relevant for DM, HTN, dyslipidemia, CHF with unknown LVEF, ex smoker, COPD and recent minor ischemic stroke. Presents brought by EMS after sustaining witnessed cardiac arrest at home. Intubated at arrival to the ED. EKG with no evidence of STEMI. Down time unclear. As per the family EMS took a long time to get there, CPR by family member for 30 minutes to an hour, EMS did about 13 minutes and 2 rounds of epi. Initial rhythm was asystole. Patient unresponsive.  SIGNIFICANT EVENTS / STUDIES:  5/30- CT scan of the head with no evidence of intracranial bleed.  5/31- hypothermia 5/31 echo - 60=65%, Pa 55, no per effusions, The interventricular septum was D-shaped, suggesting RV pressure/volume overload. 6/1- poor neuro examination, off all sedation 6/1 eeg>>>severe generalized continuous nonspecific slowing of cerebral activity 6/2- no major changes in neurostatus  LINES / TUBES: - CVL 5/3\0>>> -ett 5/30>>> - A line 5/30>>>out  CULTURES: - Blood cultures 5/30>>>GPC 1/2>>> - Tracheal aspirate culture ordered - Urine culture 5/30>>>E coli  ANTIBIOTICS: - Zosyn 6/1>>>off -ceftriaxone 6/1>>>  SUBJECTIVE:dnr established  VITAL SIGNS: Temp:  [97.9 F (36.6 C)-98.7 F (37.1 C)] 98.4 F (36.9 C) (06/02 0800) Pulse Rate:  [78-104] 91 (06/02 1000) Resp:  [0-30] 0 (06/02 1000) BP: (88-136)/(27-88) 124/36 mmHg (06/02 1000) SpO2:  [87 %-100 %] 90 % (06/02 0900) Arterial Line BP: (77-140)/(70-82) 77/70 mmHg (06/01 1300) FiO2 (%):  [40 %-100 %] 70 % (06/02 0900) Weight:  [153.316 kg (338 lb)] 153.316 kg (338 lb) (06/02 0247) HEMODYNAMICS: CVP:  [17 mmHg-27 mmHg] 23 mmHg VENTILATOR SETTINGS: Vent Mode:  [-] PRVC FiO2 (%):  [40  %-100 %] 70 % Set Rate:  [30 bmp] 30 bmp Vt Set:  [450 mL] 450 mL PEEP:  [5 cmH20] 5 cmH20 Plateau Pressure:  [21 cmH20-33 cmH20] 21 cmH20 INTAKE / OUTPUT: Intake/Output     06/01 0701 - 06/02 0700 06/02 0701 - 06/03 0700   I.V. (mL/kg) 531.7 (3.5) 75 (0.5)   NG/GT 650 235   IV Piggyback 50 50   Total Intake(mL/kg) 1231.7 (8) 360 (2.3)   Urine (mL/kg/hr) 405 (0.1) 50 (0.1)   Emesis/NG output 60 (0)    Total Output 465 50   Net +766.7 +310          PHYSICAL EXAMINATION: General:obese, female, not awake Neuro: per sluggish, moves right lower ext to pain, has cough, gag weak, not follow commands HEENT: obese, ett PULM: coarse  CV: s1 s2 rrr distant GI: soft, BS , no r/g Extremities: edema 1 plus    LABS:  PULMONARY  Recent Labs Lab 03/13/2014 2248 03/15/14 0200 03/15/14 0434 03/15/14 0613 03/15/14 0750 03/15/14 0854 03/17/14 0906 03/18/14 0320  PHART 7.282*  --  7.468*  --   --   --  7.385 7.380  PCO2ART 50.1*  --  33.6*  --   --   --  45.1* 44.1  PO2ART 72.0*  --  157.0*  --   --   --  69.0* 94.0  HCO3 24.2*  --  24.9*  --   --   --  27.0* 25.5*  TCO2 26  --  26 23  --  22 28 26.9  O2SAT 94.0 63.3 100.0  --  73.1  --  93.0 98.7    CBC  Recent Labs Lab 03/16/14 0551 03/17/14 0400 03/18/14 0400  HGB 10.4* 10.9* 8.8*  HCT 31.5* 33.2* 27.1*  WBC 10.3 13.0* 9.1  PLT 129* 140* 96*    COAGULATION  Recent Labs Lab 2014/09/18 2152 03/15/14 0230 03/16/14 0551  INR 1.78* 1.77* 1.67*    CARDIAC    Recent Labs Lab 2014/09/18 2158 03/15/14 1006 03/15/14 1848 03/16/14 0247  TROPONINI <0.30 <0.30 <0.30 <0.30    Recent Labs Lab 03/16/14 0551  PROBNP 3139.0*     CHEMISTRY  Recent Labs Lab 03/15/14 1200 03/15/14 1600  03/16/14 0551  03/16/14 1200 03/16/14 1607 03/16/14 2359 03/17/14 0400 03/18/14 0400  NA 141 139  < > 141  < > 142 140 142 142 142  K 4.3 4.2  < > 3.7  < > 3.9 3.7 3.8 4.1 4.2  CL 98 100  < > 99  < > 100 99 101 101 102   CO2 21 21  < > 21  < > 22 22 23 24 24   GLUCOSE 135* 147*  < > 166*  < > 182* 172* 154* 147* 159*  BUN 41* 45*  < > 46*  < > 48* 47* 49* 50* 56*  CREATININE 2.18* 2.21*  < > 2.42*  < > 2.50* 2.55* 2.83* 2.92* 3.36*  CALCIUM 9.2 9.3  < > 9.3  < > 9.2 9.1 9.1 9.1 8.6  MG  --  2.1  --  2.1  --   --   --   --  2.2 2.1  PHOS  --  4.9*  --  5.2*  --   --   --   --  5.4* 5.7*  < > = values in this interval not displayed. Estimated Creatinine Clearance: 25.6 ml/min (by C-G formula based on Cr of 3.36).   LIVER  Recent Labs Lab 2014/09/18 2152 03/15/14 0230 03/16/14 0551  AST 57*  --   --   ALT 28  --   --   ALKPHOS 114  --   --   BILITOT 2.5*  --   --   PROT 7.7  --   --   ALBUMIN 3.3*  --   --   INR 1.78* 1.77* 1.67*     INFECTIOUS  Recent Labs Lab 03/15/14 1007 03/16/14 0552 03/17/14 0500  LATICACIDVEN 2.9* 3.2* 2.1     ENDOCRINE CBG (last 3)   Recent Labs  03/17/14 1944 03/17/14 2347 03/18/14 0837  GLUCAP 149* 116* 132*     IMAGING x48h  [place] Renal Port  03/17/2014   CLINICAL DATA:  Renal failure, elevated BUN and creatinine  EXAM: RENAL/URINARY TRACT ULTRASOUND COMPLETE  COMPARISON:  02/27/2014  FINDINGS: Right Kidney:  Length: 12.2 cm. Echogenicity within normal limits. No mass or hydronephrosis visualized.  Left Kidney:  Length: 12.3 cm. Echogenicity within normal limits. No mass or hydronephrosis visualized.  Bladder:  Decompressed by Foley catheter.  Not visualized.  IMPRESSION: Normal renal ultrasound for age.   Electronically Signed   By: Ruel Favorsrevor  Shick M.D.   On: 03/17/2014 11:41   Dg Chest Port 1 View  03/17/2014   CLINICAL DATA:  Check endotracheal tube, shortness of breath.  EXAM: PORTABLE CHEST - 1 VIEW  COMPARISON:  03/16/2014.  FINDINGS: Endotracheal tube is in satisfactory position. Nasogastric tube is followed into the lower esophagus with the tip projecting beyond the inferior margin of the image. Left IJ central line tip projects  over the SVC. Heart is  enlarged, stable. Lungs are low in volume with moderate diffuse bilateral airspace disease, somewhat asymmetric in the right perihilar region. No definite pleural fluid.  IMPRESSION: Low lung volumes with moderate diffuse bilateral airspace disease, indicative of edema.   Electronically Signed   By: Leanna Battles M.D.   On: 03/17/2014 07:35    ASSESSMENT / PLAN:  PULMONARY A: 1) Acute hypoxemic respiratory failure post cardiac arrest. 2) Questionable aspiration  3) History of COPD 4) pulm edema  P:   -lasix attempts to neg balance concerning with crt/ bun noted -fio2 worsening, pao2 reviewed, if not to 70% then peep to 8 -no sbt, given vent needs  CARDIOVASCULAR A:  1) Cardiac arrest, unclear etiology, differential includes primary cardiac event, hypercarbic failure, PE 3) QTc prolonged P:  -cards also reports poor prognosis -dc lasix with arf -cortisol assessment, no roids needed -ensure tsh - wnl restarting pressors would NOT change outcome, once off Levophed to MAp goal 60  RENAL A:   1) Acute renal failure, chronic? P:   - dc lasix -bmet in am -kvo -renal US - neg  GASTROINTESTINAL A:   1) Tube feeds P:   - GI prophylaxis with protonix  HEMATOLOGIC A:   1) DVt prevention  P:  - DVT prophylaxis with SQ heparin - Will follow CBC -follow official dopplers done  INFECTIOUS A:   1) Bilateral lung infiltrates. Asp vs edema, UTI, r/o contamination BC 2) UTI P:   - ceftriaxone maintain  ENDOCRINE A:   1) DM on insulin 2) Hypoglycemic improved P:   - SSI -cortisol reviewed, no need roids  NEUROLOGIC A:   1) Post cardiac arrest. anoxic brain injury P:   -maintain off all sedation -eeg reviewed, no seziure focus -will update family, her neuro exam is slight improved, will recommend continued support, re eval in am   Ccm time 35 min   Global: dc lasix, poor prognosis  [name]. [name], MD, FACP Pgr: [phone] [facility]

## 2014-03-18 NOTE — Progress Notes (Signed)
Pt. With episode of agitation, and biting tube after was repositioned and changed. Pt. Still not following commands but moves rt. Arm intermittently and head side by side. E-link was notified and Dr. Glenna Fellows made orders . Given fentanyl IV and started on propofol drip with relief. Will cont. To monitor pt.

## 2014-03-19 DIAGNOSIS — A491 Streptococcal infection, unspecified site: Secondary | ICD-10-CM

## 2014-03-19 DIAGNOSIS — B954 Other streptococcus as the cause of diseases classified elsewhere: Secondary | ICD-10-CM

## 2014-03-19 DIAGNOSIS — R7881 Bacteremia: Secondary | ICD-10-CM

## 2014-03-19 LAB — CBC WITH DIFFERENTIAL/PLATELET
BASOS ABS: 0 10*3/uL (ref 0.0–0.1)
Basophils Relative: 0 % (ref 0–1)
Eosinophils Absolute: 0.3 10*3/uL (ref 0.0–0.7)
Eosinophils Relative: 3 % (ref 0–5)
HCT: 27.3 % — ABNORMAL LOW (ref 36.0–46.0)
Hemoglobin: 9.2 g/dL — ABNORMAL LOW (ref 12.0–15.0)
Lymphocytes Relative: 9 % — ABNORMAL LOW (ref 12–46)
Lymphs Abs: 0.9 10*3/uL (ref 0.7–4.0)
MCH: 26.4 pg (ref 26.0–34.0)
MCHC: 33.7 g/dL (ref 30.0–36.0)
MCV: 78.4 fL (ref 78.0–100.0)
Monocytes Absolute: 2 10*3/uL — ABNORMAL HIGH (ref 0.1–1.0)
Monocytes Relative: 20 % — ABNORMAL HIGH (ref 3–12)
Neutro Abs: 6.9 10*3/uL (ref 1.7–7.7)
Neutrophils Relative %: 69 % (ref 43–77)
PLATELETS: 113 10*3/uL — AB (ref 150–400)
RBC: 3.48 MIL/uL — ABNORMAL LOW (ref 3.87–5.11)
RDW: 19.5 % — ABNORMAL HIGH (ref 11.5–15.5)
WBC: 10 10*3/uL (ref 4.0–10.5)

## 2014-03-19 LAB — BASIC METABOLIC PANEL
BUN: 69 mg/dL — ABNORMAL HIGH (ref 6–23)
CO2: 27 mEq/L (ref 19–32)
Calcium: 9.1 mg/dL (ref 8.4–10.5)
Chloride: 102 mEq/L (ref 96–112)
Creatinine, Ser: 3.84 mg/dL — ABNORMAL HIGH (ref 0.50–1.10)
GFR calc Af Amer: 13 mL/min — ABNORMAL LOW (ref >90)
GFR, EST NON AFRICAN AMERICAN: 11 mL/min — AB (ref >90)
Glucose, Bld: 198 mg/dL — ABNORMAL HIGH (ref 70–99)
Potassium: 4.9 mEq/L (ref 3.7–5.3)
SODIUM: 144 meq/L (ref 137–147)

## 2014-03-19 LAB — GLUCOSE, CAPILLARY
GLUCOSE-CAPILLARY: 145 mg/dL — AB (ref 70–99)
GLUCOSE-CAPILLARY: 124 mg/dL — AB (ref 70–99)
Glucose-Capillary: 182 mg/dL — ABNORMAL HIGH (ref 70–99)
Glucose-Capillary: 119 mg/dL — ABNORMAL HIGH (ref 70–99)

## 2014-03-19 LAB — TRIGLYCERIDES: Triglycerides: 69 mg/dL (ref <150)

## 2014-03-19 LAB — MAGNESIUM: MAGNESIUM: 2.3 mg/dL (ref 1.5–2.5)

## 2014-03-19 LAB — PHOSPHORUS: Phosphorus: 5.9 mg/dL — ABNORMAL HIGH (ref 2.3–4.6)

## 2014-03-19 MED ORDER — FUROSEMIDE 10 MG/ML IJ SOLN
40.0000 mg | Freq: Once | INTRAMUSCULAR | Status: DC
Start: 2014-03-19 — End: 2014-03-19

## 2014-03-19 MED ORDER — SODIUM CHLORIDE 0.9 % IV SOLN: INTRAVENOUS | Status: DC

## 2014-03-19 MED ORDER — DEXTROSE 5 % IV SOLN
2.0000 g | INTRAVENOUS | Status: DC
  Administered 2014-03-20 – 2014-03-21 (×2): 2 g via INTRAVENOUS
  Filled 2014-03-19 (×2): qty 2

## 2014-03-19 NOTE — Consult Note (Addendum)
[facility]  Total days of antibiotics 6        Day 3 ceftriaxone               Reason for Consult:strep viridans bacteremia    Referring Physician: feinstein  Active Problems:   Cardiac arrest   Acute respiratory failure with hypoxia   Acute on chronic renal failure   Coma   Acute encephalopathy    HPI: Martha Gross is a 68 y.o. female with morbid obese AA female with PMH of DM, HTN, dyslipidemia, CHF with unknown LVEF, ex smoker, COPD and recent minor ischemic stroke. Presents brought by EMS after sustaining witnessed cardiac arrest at home on 02/17/2014, unclear how long she had been unconscious, family administered CPR for 30- 68mn in addition to EMS management of asystole . Intubated at arrival to the ED. EKG with no evidence of STEMI.Patient unresponsive, intubated/sedated, dyssynchrony on vent overnight. Infectious work up shows +UA and urine culture with pansensitive ecoli plus blood cx on admit shows 1 of 2 strep viridans. Repeat blood cx on 6/1 ngtd. She was initially started on piptazo, narrowed to ceftriaxone. Remains afebrile   Past Medical History  Diagnosis Date  . CHF (congestive heart failure)   . Diabetes mellitus without complication   . COPD (chronic obstructive pulmonary disease)   . Hypertension     Allergies: No Known Allergies     MEDICATIONS: . antiseptic oral rinse  15 mL Mouth Rinse QID  . [START ON 03/20/2014] cefTRIAXone (ROCEPHIN)  IV  2 g Intravenous Q24H  . chlorhexidine  15 mL Mouth Rinse BID  . feeding supplement (PRO-STAT SUGAR FREE 64)  60 mL Per Tube BID  . feeding supplement (VITAL HIGH PROTEIN)  1,000 mL Per Tube Q24H  . heparin subcutaneous  5,000 Units Subcutaneous 3 times per day  . insulin aspart  2-6 Units Subcutaneous 6 times per day  . ipratropium-albuterol  3 mL Nebulization Q4H  . mupirocin ointment  1 application Nasal BID  . pantoprazole (PROTONIX) IV  40 mg Intravenous Daily  . THROMBI-PAD  1 each  Topical Once    History  Substance Use Topics  . Smoking status: Former SResearch scientist (life sciences) . Smokeless tobacco: Never Used  . Alcohol Use: No    History reviewed. No pertinent family history.  Review of Systems - Unable to obtain due to AMS  OBJECTIVE: Temp:  [97.9 F (36.6 C)-99.5 F (37.5 C)] 99.3 F (37.4 C) (06/03 1100) Pulse Rate:  [71-105] 73 (06/03 1100) Resp:  [0-59] 59 (06/03 1100) BP: (85-170)/(39-90) 87/44 mmHg (06/03 1100) SpO2:  [94 %-100 %] 95 % (06/03 1145) FiO2 (%):  [40 %-50 %] 40 % (06/03 1145) Weight:  [339 lb (153.769 kg)] 339 lb (153.769 kg) (06/03 0500)   Constitutional:   appears well-developed and well-nourished. intubated HENT:  Mouth/Throat: Oropharynx is clear and moist. No oropharyngeal exudate.  Cardiovascular: Normal rate, regular rhythm and normal heart sounds. Exam reveals no gallop and no friction rub.  No murmur heard.  Pulmonary/Chest: Effort normal and breath sounds normal. No respiratory distress.  has no wheezes.  Abdominal: Soft. Bowel sounds are decreased  exhibits no distension. There is no tenderness.  Lymphadenopathy: no cervical adenopathy.  Skin: Skin is warm and dry. No rash noted. No erythema. Plantar wart to left foot, bilateral onychomycosis to toes of bilateral feet   LABS: Results for orders placed during the hospital encounter of 03/16/2014 (from the past 48 hour(s))  GLUCOSE, CAPILLARY     Status: Abnormal   Collection Time    03/17/14 12:14 PM      Result Value Ref Range   Glucose-Capillary 116 (*) 70 - 99 mg/dL  CULTURE, BLOOD (ROUTINE X 2)     Status: None   Collection Time    03/17/14  2:50 PM      Result Value Ref Range   Specimen Description BLOOD LEFT HAND     Special Requests BOTTLES DRAWN AEROBIC ONLY 1CC     Culture  Setup Time       Value: 03/17/2014 20:41     Performed at [facility]   Culture       Value:        BLOOD CULTURE RECEIVED NO GROWTH TO DATE CULTURE WILL BE HELD FOR 5 DAYS BEFORE ISSUING A  FINAL NEGATIVE REPORT     Performed at [facility]   Report Status PENDING    CULTURE, BLOOD (ROUTINE X 2)     Status: None   Collection Time    03/17/14  3:25 PM      Result Value Ref Range   Specimen Description BLOOD RIGHT HAND     Special Requests BOTTLES DRAWN AEROBIC ONLY 0.5CC     Culture  Setup Time       Value: 03/17/2014 20:42     Performed at [facility]   Culture       Value:        BLOOD CULTURE RECEIVED NO GROWTH TO DATE CULTURE WILL BE HELD FOR 5 DAYS BEFORE ISSUING A FINAL NEGATIVE REPORT     Performed at [facility]   Report Status PENDING    GLUCOSE, CAPILLARY     Status: Abnormal   Collection Time    03/17/14  4:06 PM      Result Value Ref Range   Glucose-Capillary 100 (*) 70 - 99 mg/dL  GLUCOSE, CAPILLARY     Status: Abnormal   Collection Time    03/17/14  7:44 PM      Result Value Ref Range   Glucose-Capillary 149 (*) 70 - 99 mg/dL  GLUCOSE, CAPILLARY     Status: Abnormal   Collection Time    03/17/14 11:47 PM      Result Value Ref Range   Glucose-Capillary 116 (*) 70 - 99 mg/dL  GLUCOSE, CAPILLARY     Status: Abnormal   Collection Time    03/18/14  3:15 AM      Result Value Ref Range   Glucose-Capillary 117 (*) 70 - 99 mg/dL  BLOOD GAS, ARTERIAL     Status: Abnormal   Collection Time    03/18/14  3:20 AM      Result Value Ref Range   FIO2 0.40     Delivery systems VENTILATOR     Mode PRESSURE REGULATED VOLUME CONTROL     VT 450     Rate 30     Peep/cpap 5.0     pH, Arterial 7.380  7.350 - 7.450   pCO2 arterial 44.1  35.0 - 45.0 mmHg   pO2, Arterial 94.0  80.0 - 100.0 mmHg   Bicarbonate 25.5 (*) 20.0 - 24.0 mEq/L   TCO2 26.9  0 - 100 mmol/L   Acid-Base Excess 1.0  0.0 - 2.0 mmol/L   O2 Saturation 98.7     Patient temperature 98.6     Collection site LEFT RADIAL     Drawn by [id number]  Sample type ARTERIAL DRAW     Allens test (pass/fail) PASS  PASS  CBC WITH DIFFERENTIAL     Status: Abnormal   Collection Time      03/18/14  4:00 AM      Result Value Ref Range   WBC 9.1  4.0 - 10.5 K/uL   RBC 3.37 (*) 3.87 - 5.11 MIL/uL   Hemoglobin 8.8 (*) 12.0 - 15.0 g/dL   Comment: REPEATED TO VERIFY   HCT 27.1 (*) 36.0 - 46.0 %   MCV 80.4  78.0 - 100.0 fL   MCH 26.1  26.0 - 34.0 pg   MCHC 32.5  30.0 - 36.0 g/dL   RDW 20.0 (*) 11.5 - 15.5 %   Platelets 96 (*) 150 - 400 K/uL   Comment: DELTA CHECK NOTED     REPEATED TO VERIFY     PLATELET COUNT CONFIRMED BY SMEAR   Neutrophils Relative % 77  43 - 77 %   Neutro Abs 6.9  1.7 - 7.7 K/uL   Lymphocytes Relative 8 (*) 12 - 46 %   Lymphs Abs 0.7  0.7 - 4.0 K/uL   Monocytes Relative 14 (*) 3 - 12 %   Monocytes Absolute 1.3 (*) 0.1 - 1.0 K/uL   Eosinophils Relative 1  0 - 5 %   Eosinophils Absolute 0.1  0.0 - 0.7 K/uL   Basophils Relative 0  0 - 1 %   Basophils Absolute 0.0  0.0 - 0.1 K/uL  BASIC METABOLIC PANEL     Status: Abnormal   Collection Time    03/18/14  4:00 AM      Result Value Ref Range   Sodium 142  137 - 147 mEq/L   Potassium 4.2  3.7 - 5.3 mEq/L   Chloride 102  96 - 112 mEq/L   CO2 24  19 - 32 mEq/L   Glucose, Bld 159 (*) 70 - 99 mg/dL   BUN 56 (*) 6 - 23 mg/dL   Creatinine, Ser 3.36 (*) 0.50 - 1.10 mg/dL   Calcium 8.6  8.4 - 10.5 mg/dL   GFR calc non Af Amer 13 (*) >90 mL/min   GFR calc Af Amer 15 (*) >90 mL/min   Comment: (NOTE)     The eGFR has been calculated using the CKD EPI equation.     This calculation has not been validated in all clinical situations.     eGFR's persistently <90 mL/min signify possible Chronic Kidney     Disease.  PHOSPHORUS     Status: Abnormal   Collection Time    03/18/14  4:00 AM      Result Value Ref Range   Phosphorus 5.7 (*) 2.3 - 4.6 mg/dL  MAGNESIUM     Status: None   Collection Time    03/18/14  4:00 AM      Result Value Ref Range   Magnesium 2.1  1.5 - 2.5 mg/dL  GLUCOSE, CAPILLARY     Status: Abnormal   Collection Time    03/18/14  8:37 AM      Result Value Ref Range   Glucose-Capillary  132 (*) 70 - 99 mg/dL  GLUCOSE, CAPILLARY     Status: Abnormal   Collection Time    03/18/14 11:55 AM      Result Value Ref Range   Glucose-Capillary 160 (*) 70 - 99 mg/dL  GLUCOSE, CAPILLARY     Status: Abnormal   Collection Time    03/18/14  4:33 PM  Result Value Ref Range   Glucose-Capillary 156 (*) 70 - 99 mg/dL  GLUCOSE, CAPILLARY     Status: Abnormal   Collection Time    03/18/14  7:40 PM      Result Value Ref Range   Glucose-Capillary 131 (*) 70 - 99 mg/dL  GLUCOSE, CAPILLARY     Status: Abnormal   Collection Time    03/18/14 11:48 PM      Result Value Ref Range   Glucose-Capillary 148 (*) 70 - 99 mg/dL  GLUCOSE, CAPILLARY     Status: Abnormal   Collection Time    03/19/14  3:53 AM      Result Value Ref Range   Glucose-Capillary 182 (*) 70 - 99 mg/dL  CBC WITH DIFFERENTIAL     Status: Abnormal   Collection Time    03/19/14  4:17 AM      Result Value Ref Range   WBC 10.0  4.0 - 10.5 K/uL   RBC 3.48 (*) 3.87 - 5.11 MIL/uL   Hemoglobin 9.2 (*) 12.0 - 15.0 g/dL   HCT 27.3 (*) 36.0 - 46.0 %   MCV 78.4  78.0 - 100.0 fL   MCH 26.4  26.0 - 34.0 pg   MCHC 33.7  30.0 - 36.0 g/dL   RDW 19.5 (*) 11.5 - 15.5 %   Platelets 113 (*) 150 - 400 K/uL   Comment: CONSISTENT WITH PREVIOUS RESULT   Neutrophils Relative % 69  43 - 77 %   Neutro Abs 6.9  1.7 - 7.7 K/uL   Lymphocytes Relative 9 (*) 12 - 46 %   Lymphs Abs 0.9  0.7 - 4.0 K/uL   Monocytes Relative 20 (*) 3 - 12 %   Monocytes Absolute 2.0 (*) 0.1 - 1.0 K/uL   Eosinophils Relative 3  0 - 5 %   Eosinophils Absolute 0.3  0.0 - 0.7 K/uL   Basophils Relative 0  0 - 1 %   Basophils Absolute 0.0  0.0 - 0.1 K/uL  BASIC METABOLIC PANEL     Status: Abnormal   Collection Time    03/19/14  4:17 AM      Result Value Ref Range   Sodium 144  137 - 147 mEq/L   Potassium 4.9  3.7 - 5.3 mEq/L   Chloride 102  96 - 112 mEq/L   CO2 27  19 - 32 mEq/L   Glucose, Bld 198 (*) 70 - 99 mg/dL   BUN 69 (*) 6 - 23 mg/dL   Creatinine, Ser  3.84 (*) 0.50 - 1.10 mg/dL   Calcium 9.1  8.4 - 10.5 mg/dL   GFR calc non Af Amer 11 (*) >90 mL/min   GFR calc Af Amer 13 (*) >90 mL/min   Comment: (NOTE)     The eGFR has been calculated using the CKD EPI equation.     This calculation has not been validated in all clinical situations.     eGFR's persistently <90 mL/min signify possible Chronic Kidney     Disease.  PHOSPHORUS     Status: Abnormal   Collection Time    03/19/14  4:17 AM      Result Value Ref Range   Phosphorus 5.9 (*) 2.3 - 4.6 mg/dL  MAGNESIUM     Status: None   Collection Time    03/19/14  4:17 AM      Result Value Ref Range   Magnesium 2.3  1.5 - 2.5 mg/dL  TRIGLYCERIDES  Status: None   Collection Time    03/19/14  4:17 AM      Result Value Ref Range   Triglycerides 69  <150 mg/dL  GLUCOSE, CAPILLARY     Status: Abnormal   Collection Time    03/19/14  7:50 AM      Result Value Ref Range   Glucose-Capillary 119 (*) 70 - 99 mg/dL    MICRO: 5/29 blood cx 1 of 2 strep viridans 6/1 blood cx x 2 ngtd 5/29 urine cx ecoli (pan sensitive)  IMAGING: tte 5/31  - Left ventricle: The cavity size was normal. Wall thickness was increased in a pattern of mild LVH. Systolic function was normal. The estimated ejection fraction was in the range of 60% to 65%. Wall motion was normal; there were no regional wall motion abnormalities. Features are consistent with a pseudonormal left ventricular filling pattern, with concomitant abnormal relaxation and increased filling pressure (grade 2 diastolic dysfunction). - Ventricular septum: The interventricular septum was D-shaped, suggesting RV pressure/volume overload. - Aortic valve: Trileaflet; moderately calcified leaflets. Sclerosis without stenosis. - Mitral valve: Mildly to moderately calcified annulus. Mildly calcified leaflets . There was no significant regurgitation. - Left atrium: The atrium was mildly to moderately dilated. - Right ventricle: The cavity size  was moderately to severely dilated. Systolic function was mildly reduced. - Right atrium: The atrium was mildly to moderately dilated. - Tricuspid valve: Peak RV-RA gradient (S): 40 mm Hg. - Pulmonary arteries: PA peak pressure: 55 mm Hg (S). - Systemic veins: IVC measured 3.0 cm with minimal respirophasic variation, suggesting RA pressure 15 mmHg. - Pericardium, extracardiac: A trivial pericardial effusion was identified posterior to the heart.  Impressions:  - Normal LV size with mild LV hypertrophy. EF 60-65%. D-shaped interventricular pressure suggests RV pressure/volume overload. Moderate to severely dilated RV with mildly decreased systolic function. Moderate pulmonary hypertension.  TEE: 6/3 no vegetations  Assessment/Plan: 68yo F who sustained cardiac arrest/asystole with prolonged rescucitation found to have strep viridans bacteremia. Unclear etiology or if it contributed to cardiac arrest. TTE & TEE does not suggestive of endocarditis  - continue with ceftriaxone, will increase to 2gm iv for bacteremia to treat for a total of 14 days from documented clearance of bacteremia. - will follow repeat blood cultures to document clearance - if patient becomes more alert can determine if she had recent episode of dental work/dental pain that could attribute to her bacteremia. - ecoli uti covered with cefriaxone   Zamar Odwyer B. [facility] [phone]

## 2014-03-19 NOTE — Progress Notes (Signed)
Echocardiogram Echocardiogram Transesophageal has been performed.  Martha Gross 03/19/2014, 2:36 PM

## 2014-03-19 NOTE — CV Procedure (Signed)
INDICATIONS: infective endocarditis (strep viridans bacteremia)  PROCEDURE:   Informed consent was obtained prior to the procedure, from the patient's daughter. The risks, benefits and alternatives for the procedure were discussed and the patient's family comprehended these risks.  Risks include, but are not limited to, cough, sore throat, vomiting, nausea, somnolence, esophageal and stomach trauma or perforation, bleeding, low blood pressure, aspiration, pneumonia, infection, trauma to the teeth and death.    The patient was already receiving IV propofol for moderate sedation.   The transesophageal probe was inserted in the esophagus and stomach without difficulty and multiple views were obtained.  The patient was kept under observation until the patient left the procedure room.  The patient left the procedure room in stable condition.   Agitated microbubble saline contrast was not administered.  COMPLICATIONS:    There were no immediate complications.  FINDINGS:  No evidence of endocarditis. Mild aortic and mitral valve degenerative changes. Normal LVEF and wall motion. Pseudonormal mitral valve filling pattern (high mean LA pressure) Dilated and depressed right ventricle. Dilated right atrium. Estimated systolic PA pressure 45-50 mm Gf  RECOMMENDATIONS:   Evaluate for alternative source of S. Viridans bacteremia. Diuresis  Time Spent Directly with the Patient:  45 minutes   Jennavecia Schwier 03/19/2014, 2:38 PM

## 2014-03-19 NOTE — Progress Notes (Signed)
PULMONARY / CRITICAL CARE MEDICINE   Name: Martha Gross MRN: [id number] DOB: 12-Mar-1946    ADMISSION DATE:  03/12/2014  PRIMARY SERVICE: PCCM  CHIEF COMPLAINT:   Witnessed cardiac arrest.  BRIEF PATIENT DESCRIPTION:  68 years old morbid obese AA female with PMH relevant for DM, HTN, dyslipidemia, CHF with unknown LVEF, ex smoker, COPD and recent minor ischemic stroke. Presents brought by EMS after sustaining witnessed cardiac arrest at home. Intubated at arrival to the ED. EKG with no evidence of STEMI. Down time unclear. As per the family EMS took a long time to get there, CPR by family member for 30 minutes to an hour, EMS did about 13 minutes and 2 rounds of epi. Initial rhythm was asystole. Patient unresponsive.  SIGNIFICANT EVENTS / STUDIES:  5/30- CT scan of the head with no evidence of intracranial bleed.  5/31- hypothermia 5/31 echo - 60=65%, Pa 55, no per effusions, The interventricular septum was D-shaped, suggesting RV pressure/volume overload. 5/31- doppler lowers neg 6/1- renal US>>>-renal US 6/1- poor neuro examination, off all sedation 6/1 eeg>>>severe generalized continuous nonspecific slowing of cerebral activity 6/2- no major changes in neurostatus 6/3- followed commands x 1, required sedation  LINES / TUBES: - CVL 5/3\0>>> -ett 5/30>>> - A line 5/30>>>out  CULTURES: - Blood cultures 5/30>>>strep ver BC 6/1>>>NGTD - Tracheal aspirate culture ordered - Urine culture 5/30>>>E coli  ANTIBIOTICS: - Zosyn 6/1>>>off -ceftriaxone 6/1>>>  SUBJECTIVE: agitated  VITAL SIGNS: Temp:  [97.9 F (36.6 C)-98.5 F (36.9 C)] 98.3 F (36.8 C) (06/03 0700) Pulse Rate:  [71-105] 83 (06/03 0800) Resp:  [0-47] 24 (06/03 0800) BP: (87-170)/(36-90) 123/80 mmHg (06/03 0800) SpO2:  [96 %-100 %] 100 % (06/03 0800) FiO2 (%):  [50 %-60 %] 50 % (06/03 0759) Weight:  [153.769 kg (339 lb)] 153.769 kg (339 lb) (06/03 0500) HEMODYNAMICS: CVP:  [25 mmHg-29 mmHg] 29  mmHg VENTILATOR SETTINGS: Vent Mode:  [-] PRVC FiO2 (%):  [50 %-60 %] 50 % Set Rate:  [30 bmp] 30 bmp Vt Set:  [450 mL] 450 mL PEEP:  [8 cmH20] 8 cmH20 Plateau Pressure:  [20 cmH20-24 cmH20] 24 cmH20 INTAKE / OUTPUT: Intake/Output     06/02 0701 - 06/03 0700 06/03 0701 - 06/04 0700   I.V. (mL/kg) 1602.1 (10.4) 273.2 (1.8)   NG/GT 1180 90   IV Piggyback 50    Total Intake(mL/kg) 2832.1 (18.4) 363.2 (2.4)   Urine (mL/kg/hr) 750 (0.2)    Emesis/NG output     Total Output 750     Net +2082.1 +363.2          PHYSICAL EXAMINATION: General:obese Neuro: per sluggish,moved toes rt for RN, rass 0 HEENT: obese, ett PULM: coarse , distant CV: s1 s2 rrr distant GI: soft, BS , no r/g Extremities: edema 2 plus    LABS:  PULMONARY  Recent Labs Lab 02/22/2014 2248 03/15/14 0200 03/15/14 0434 03/15/14 0613 03/15/14 0750 03/15/14 0854 03/17/14 0906 03/18/14 0320  PHART 7.282*  --  7.468*  --   --   --  7.385 7.380  PCO2ART 50.1*  --  33.6*  --   --   --  45.1* 44.1  PO2ART 72.0*  --  157.0*  --   --   --  69.0* 94.0  HCO3 24.2*  --  24.9*  --   --   --  27.0* 25.5*  TCO2 26  --  26 23  --  22 28 26.9  O2SAT 94.0 63.3 100.0  --  73.1  --  93.0 98.7    CBC  Recent Labs Lab 03/17/14 0400 03/18/14 0400 03/19/14 0417  HGB 10.9* 8.8* 9.2*  HCT 33.2* 27.1* 27.3*  WBC 13.0* 9.1 10.0  PLT 140* 96* 113*    COAGULATION  Recent Labs Lab 03/06/2014 2152 03/15/14 0230 03/16/14 0551  INR 1.78* 1.77* 1.67*    CARDIAC    Recent Labs Lab 03/09/2014 2158 03/15/14 1006 03/15/14 1848 03/16/14 0247  TROPONINI <0.30 <0.30 <0.30 <0.30    Recent Labs Lab 03/16/14 0551  PROBNP 3139.0*     CHEMISTRY  Recent Labs Lab 03/15/14 1600  03/16/14 0551  03/16/14 1607 03/16/14 2359 03/17/14 0400 03/18/14 0400 03/19/14 0417  NA 139  < > 141  < > 140 142 142 142 144  K 4.2  < > 3.7  < > 3.7 3.8 4.1 4.2 4.9  CL 100  < > 99  < > 99 101 101 102 102  CO2 21  < > 21  < > 22  23 24 24 27   GLUCOSE 147*  < > 166*  < > 172* 154* 147* 159* 198*  BUN 45*  < > 46*  < > 47* 49* 50* 56* 69*  CREATININE 2.21*  < > 2.42*  < > 2.55* 2.83* 2.92* 3.36* 3.84*  CALCIUM 9.3  < > 9.3  < > 9.1 9.1 9.1 8.6 9.1  MG 2.1  --  2.1  --   --   --  2.2 2.1 2.3  PHOS 4.9*  --  5.2*  --   --   --  5.4* 5.7* 5.9*  < > = values in this interval not displayed. Estimated Creatinine Clearance: 22.4 ml/min (by C-G formula based on Cr of 3.84).   LIVER  Recent Labs Lab 02/28/2014 2152 03/15/14 0230 03/16/14 0551  AST 57*  --   --   ALT 28  --   --   ALKPHOS 114  --   --   BILITOT 2.5*  --   --   PROT 7.7  --   --   ALBUMIN 3.3*  --   --   INR 1.78* 1.77* 1.67*     INFECTIOUS  Recent Labs Lab 03/15/14 1007 03/16/14 0552 03/17/14 0500  LATICACIDVEN 2.9* 3.2* 2.1     ENDOCRINE CBG (last 3)   Recent Labs  03/18/14 2348 03/19/14 0353 03/19/14 0750  GLUCAP 148* 182* 119*     IMAGING x48h  US Renal Port  03/17/2014   CLINICAL DATA:  Renal failure, elevated BUN and creatinine  EXAM: RENAL/URINARY TRACT ULTRASOUND COMPLETE  COMPARISON:  02/27/2014  FINDINGS: Right Kidney:  Length: 12.2 cm. Echogenicity within normal limits. No mass or hydronephrosis visualized.  Left Kidney:  Length: 12.3 cm. Echogenicity within normal limits. No mass or hydronephrosis visualized.  Bladder:  Decompressed by Foley catheter.  Not visualized.  IMPRESSION: Normal renal ultrasound for age.   Electronically Signed   By: Ruel Favors M.D.   On: 03/17/2014 11:41    ASSESSMENT / PLAN:  PULMONARY A: 1) Acute hypoxemic respiratory failure post cardiac arrest. 2) Questionable aspiration  3) History of COPD 4) pulm edema 5 likely osa undx  P:   -peep 8 required, goal to reduce to 5, if needed would accept 60% , in order to cpap 5 ps 5, on 60% escalate PS if needed -may require early trach with ? Improved neurostatus -pcxr assess in am for volume status -unable to spiral Ct chest, dopplers  are negative  CARDIOVASCULAR A:  1) Cardiac arrest, unclear etiology, differential includes primary cardiac event, hypercarbic failure, PE 3) QTc prolonged R/o endocarditis P:  -tele -goal MAP 55-60 -maintain ebx Will discuss TEE with cards  RENAL A:   1) Acute renal failure, chronic?, ATN likely P:   - keep lasix off -bmet in am -kvo  GASTROINTESTINAL A:   1) Tube feeds P:   - GI prophylaxis with protonix -TF -assess last BM  HEMATOLOGIC A:   1) DVt prevention  P:  - DVT prophylaxis with SQ heparin - Will follow CBC  INFECTIOUS A:   1) Bilateral lung infiltrates. Asp vs edema, UTI, r/o contamination BC 2) UTI P:   - ceftriaxone maintain -consider TEE -ID consult  ENDOCRINE A:   1) DM on insulin 2) Hypoglycemic improved P:   - SSI  NEUROLOGIC A:   1) Post cardiac arrest. anoxic brain injury P:   -may have improved neurostatus -propofol, WUA  Ccm time 35 min   Global: followed commands, wean attempt on 60%, TEE? ID consult  Mcarthur Rossettianiel J. Tyson AliasFeinstein, MD, FACP Pgr: [phone]  Pulmonary & Critical Care

## 2014-03-19 NOTE — Progress Notes (Signed)
Patient Name: Martha Gross Date of Encounter: 03/19/2014  Active Problems:   Cardiac arrest   Acute respiratory failure with hypoxia   Acute on chronic renal failure   Coma   Acute encephalopathy   Length of Stay: 5  SUBJECTIVE  Remarkably, there may be some signs of potential neuro recovery Blood culture growing Strep viridans  CURRENT MEDS . antiseptic oral rinse  15 mL Mouth Rinse QID  . cefTRIAXone (ROCEPHIN)  IV  1 g Intravenous Q24H  . chlorhexidine  15 mL Mouth Rinse BID  . feeding supplement (PRO-STAT SUGAR FREE 64)  60 mL Per Tube BID  . feeding supplement (VITAL HIGH PROTEIN)  1,000 mL Per Tube Q24H  . heparin subcutaneous  5,000 Units Subcutaneous 3 times per day  . insulin aspart  2-6 Units Subcutaneous 6 times per day  . ipratropium-albuterol  3 mL Nebulization Q4H  . mupirocin ointment  1 application Nasal BID  . pantoprazole (PROTONIX) IV  40 mg Intravenous Daily  . THROMBI-PAD  1 each Topical Once    OBJECTIVE   Intake/Output Summary (Last 24 hours) at 03/19/14 0944 Last data filed at 03/19/14 0900  Gross per 24 hour  Intake 2905.25 ml  Output    700 ml  Net 2205.25 ml   Filed Weights   03/17/14 0500 03/18/14 0247 03/19/14 0500  Weight: 384 lb 7.7 oz (174.4 kg) 338 lb (153.316 kg) 339 lb (153.769 kg)    PHYSICAL EXAM Filed Vitals:   03/19/14 0600 03/19/14 0700 03/19/14 0759 03/19/14 0800  BP: 90/44 105/62  123/80  Pulse: 71 76  83  Temp:  98.3 F (36.8 C)    TempSrc:  Oral    Resp: 31 20  24   Height:      Weight:      SpO2: 98% 100% 100% 100%   General: unresponsive, no movement, on ventilator  Head: no evidence of trauma, PERRL, EOMI, no exophtalmos or lid lag, no myxedema, no xanthelasma; normal ears, nose and oropharynx  Neck: cannot evaluate jugular venous pulsations or hepatojugular reflux; brisk carotid pulses without delay and no carotid bruits  Chest: clear to auscultation, no signs of consolidation by percussion or palpation,  normal fremitus, symmetrical and full respiratory excursions  Cardiovascular: cannot locate the apical impulse, regular rhythm, normal first and second heart sounds, no rubs or gallops, no murmur  Abdomen: no tenderness or distention, no masses by palpation, no abnormal pulsatility or arterial bruits, normal bowel sounds, no hepatosplenomegaly  Extremities: no clubbing, cyanosis or edema; 2+ radial, ulnar and brachial pulses bilaterally; 2+ right femoral, posterior tibial and dorsalis pedis pulses; 2+ left femoral, posterior tibial and dorsalis pedis pulses; no subclavian or femoral bruits  Neurological: unresponsive on my exam   LABS  CBC  Recent Labs  03/18/14 0400 03/19/14 0417  WBC 9.1 10.0  NEUTROABS 6.9 6.9  HGB 8.8* 9.2*  HCT 27.1* 27.3*  MCV 80.4 78.4  PLT 96* 113*   Basic Metabolic Panel  Recent Labs  03/18/14 0400 03/19/14 0417  NA 142 144  K 4.2 4.9  CL 102 102  CO2 24 27  GLUCOSE 159* 198*  BUN 56* 69*  CREATININE 3.36* 3.84*  CALCIUM 8.6 9.1  MG 2.1 2.3  PHOS 5.7* 5.9*   Liver Function Tests No results found for this basename: AST, ALT, ALKPHOS, BILITOT, PROT, ALBUMIN,  in the last 72 hours No results found for this basename: LIPASE, AMYLASE,  in the last 72 hours Cardiac Enzymes No  results found for this basename: CKTOTAL, CKMB, CKMBINDEX, TROPONINI,  in the last 72 hours BNP No components found with this basename: POCBNP,  D-Dimer No results found for this basename: DDIMER,  in the last 72 hours Hemoglobin A1C No results found for this basename: HGBA1C,  in the last 72 hours Fasting Lipid Panel  Recent Labs  03/19/14 0417  TRIG 69   Thyroid Function Tests  Recent Labs  03/17/14 1025  TSH 1.970    Radiology Studies Imaging results have been reviewed and [place] Renal Port  03/17/2014   CLINICAL DATA:  Renal failure, elevated BUN and creatinine  EXAM: RENAL/URINARY TRACT ULTRASOUND COMPLETE  COMPARISON:  02/27/2014  FINDINGS: Right Kidney:   Length: 12.2 cm. Echogenicity within normal limits. No mass or hydronephrosis visualized.  Left Kidney:  Length: 12.3 cm. Echogenicity within normal limits. No mass or hydronephrosis visualized.  Bladder:  Decompressed by Foley catheter.  Not visualized.  IMPRESSION: Normal renal ultrasound for age.   Electronically Signed   By: Ruel Favorsrevor  Shick M.D.   On: 03/17/2014 11:41    TELE NSR   ASSESSMENT AND PLAN  TEE later today to look for evidence of endocarditis. Tube feeds just stopped. Waiting for daughter Martha Gross to call back for consent.  Martha FairMihai Tarick Parenteau, MD, [facility] [phone] office [phone] pager 03/19/2014 9:44 AM

## 2014-03-20 ENCOUNTER — Inpatient Hospital Stay (HOSPITAL_COMMUNITY): Payer: Medicare Other

## 2014-03-20 LAB — GLUCOSE, CAPILLARY
GLUCOSE-CAPILLARY: 132 mg/dL — AB (ref 70–99)
Glucose-Capillary: 128 mg/dL — ABNORMAL HIGH (ref 70–99)
Glucose-Capillary: 140 mg/dL — ABNORMAL HIGH (ref 70–99)
Glucose-Capillary: 148 mg/dL — ABNORMAL HIGH (ref 70–99)
Glucose-Capillary: 149 mg/dL — ABNORMAL HIGH (ref 70–99)
Glucose-Capillary: 152 mg/dL — ABNORMAL HIGH (ref 70–99)
Glucose-Capillary: 152 mg/dL — ABNORMAL HIGH (ref 70–99)

## 2014-03-20 MED ORDER — PANTOPRAZOLE SODIUM 40 MG PO PACK
40.0000 mg | PACK | Freq: Every day | ORAL | Status: DC
  Administered 2014-03-21: 40 mg
  Filled 2014-03-20: qty 20

## 2014-03-20 NOTE — Progress Notes (Signed)
Strep viridans no longer isolated in blood cultures as of 5/30. Recommend 14 day course of treatment. Last day of ceftriaxone 2gm IV daily would be June 12th. TEE has ruled out endocarditis. Prognosis still remains poor. Will sign off. Call if questions.  Duke Salvia Drue Second MD MPH [facility] [phone]

## 2014-03-20 NOTE — Consult Note (Signed)
NEURO HOSPITALIST CONSULT NOTE    Reason for Consult: prognostication after cardiac arrest/ post anoxic encephalopathy  HPI:                                                                                                                                          Martha Gross is an 68 y.o. female with a past medical history significant for HTN, dyslipidemia, DM, chronic congestive heart failure, COPD,recent minor stroke, admitted to [facility] 5/29 after sustaining witnessed cardiac arrest at home Patient is intubated on the vent and thus all clinical information is obtained from patient's medical record. She was intubated at arrival to the ED. EKG with no evidence of STEMI. Down time unclear. As per the family EMS took a long time to get there, CPR by family member for 30 minutes to an hour, EMS did about 13 minutes and 2 rounds of epi. Initial rhythm was asystole. Patient unresponsive. Pt gagged on ETT tube overnight, was given propofol. Levophed 66mg IV given for hypotension refractory to propofol admin. CT brain showed no acute intracranial abnormality. Underwent TH 5/31. She has been unresponsive since admission and her clinical course has been complicated by acute on chronic renal  Failure. No myoclonus noted. Infectious work up shows +UA and urine culture with pansensitive ecoli plus blood cx on admit shows 1 of 2 strep viridans but negative TEE. EEG performed 6/1: severe generalized continuous nonspecific slowing without evidence pf electrographic seizures. MRI brain requested and pending. Currently, patient on propofol due to agitation.    Past Medical History  Diagnosis Date  . CHF (congestive heart failure)   . Diabetes mellitus without complication   . COPD (chronic obstructive pulmonary disease)   . Hypertension     History reviewed. No pertinent past surgical history.  History reviewed. No pertinent family history.   Social History:  reports that she has quit  smoking. She has never used smokeless tobacco. She reports that she does not drink alcohol. Her drug history is not on file.  No Known Allergies  MEDICATIONS:                                                                                                                     Scheduled: . antiseptic oral rinse  15 mL  Mouth Rinse QID  . cefTRIAXone (ROCEPHIN)  IV  2 g Intravenous Q24H  . chlorhexidine  15 mL Mouth Rinse BID  . feeding supplement (PRO-STAT SUGAR FREE 64)  60 mL Per Tube BID  . feeding supplement (VITAL HIGH PROTEIN)  1,000 mL Per Tube Q24H  . heparin subcutaneous  5,000 Units Subcutaneous 3 times per day  . insulin aspart  2-6 Units Subcutaneous 6 times per day  . ipratropium-albuterol  3 mL Nebulization Q4H  . [START ON 04/08/14] pantoprazole sodium  40 mg Per Tube Daily  . THROMBI-PAD  1 each Topical Once     ROS: unable to obtain due to mental status, intubation                                                                                                          History obtained from chart review  Physical exam: sedated with propofol and intubated on the vent. Blood pressure 117/70, pulse 71, temperature 98.1 F (36.7 C), temperature source Core (Comment), resp. rate 30, height _0  (1.702 m), weight 162.388 kg (358 lb), SpO2 96.00%. Head: normocephalic. Neck: supple, no bruits, no JVD. Cardiac: no murmurs. Lungs: coarse breath sounds Abdomen: soft, no tender, no mass. Extremities: bilateral LE edema. Neurologic Examination:                                                                                                      Mental status: becomes restless when she is off propofol. CN 2-12: pupils 3-4 mm, reactive. EOM impaired. Corneal response absent. Gag present. Face appears to be symmetric. Tongue: intubated. Motor: restless when off propofol, and has very limited motor function when propofol is resumed. Sensory: grimaces to pain and moves hands and feet upon  painful stimulation Plantars: down. Coordination and gait: unable to test.  No results found for this basename: cbc, bmp, coags, chol, tri, ldl, hga1c    Results for orders placed during the hospital encounter of 02/18/2014 (from the past 48 hour(s))  GLUCOSE, CAPILLARY     Status: Abnormal   Collection Time    03/18/14 11:48 PM      Result Value Ref Range   Glucose-Capillary 148 (*) 70 - 99 mg/dL  GLUCOSE, CAPILLARY     Status: Abnormal   Collection Time    03/19/14  3:53 AM      Result Value Ref Range   Glucose-Capillary 182 (*) 70 - 99 mg/dL  CBC WITH DIFFERENTIAL     Status: Abnormal   Collection Time    03/19/14  4:17 AM      Result Value Ref Range   WBC 10.0  4.0 - 10.5 K/uL   RBC 3.48 (*) 3.87 - 5.11 MIL/uL   Hemoglobin 9.2 (*) 12.0 - 15.0 g/dL   HCT 27.3 (*) 36.0 - 46.0 %   MCV 78.4  78.0 - 100.0 fL   MCH 26.4  26.0 - 34.0 pg   MCHC 33.7  30.0 - 36.0 g/dL   RDW 19.5 (*) 11.5 - 15.5 %   Platelets 113 (*) 150 - 400 K/uL   Comment: CONSISTENT WITH PREVIOUS RESULT   Neutrophils Relative % 69  43 - 77 %   Neutro Abs 6.9  1.7 - 7.7 K/uL   Lymphocytes Relative 9 (*) 12 - 46 %   Lymphs Abs 0.9  0.7 - 4.0 K/uL   Monocytes Relative 20 (*) 3 - 12 %   Monocytes Absolute 2.0 (*) 0.1 - 1.0 K/uL   Eosinophils Relative 3  0 - 5 %   Eosinophils Absolute 0.3  0.0 - 0.7 K/uL   Basophils Relative 0  0 - 1 %   Basophils Absolute 0.0  0.0 - 0.1 K/uL  BASIC METABOLIC PANEL     Status: Abnormal   Collection Time    03/19/14  4:17 AM      Result Value Ref Range   Sodium 144  137 - 147 mEq/L   Potassium 4.9  3.7 - 5.3 mEq/L   Chloride 102  96 - 112 mEq/L   CO2 27  19 - 32 mEq/L   Glucose, Bld 198 (*) 70 - 99 mg/dL   BUN 69 (*) 6 - 23 mg/dL   Creatinine, Ser 3.84 (*) 0.50 - 1.10 mg/dL   Calcium 9.1  8.4 - 10.5 mg/dL   GFR calc non Af Amer 11 (*) >90 mL/min   GFR calc Af Amer 13 (*) >90 mL/min   Comment: (NOTE)     The eGFR has been calculated using the CKD EPI equation.     This  calculation has not been validated in all clinical situations.     eGFR's persistently <90 mL/min signify possible Chronic Kidney     Disease.  PHOSPHORUS     Status: Abnormal   Collection Time    03/19/14  4:17 AM      Result Value Ref Range   Phosphorus 5.9 (*) 2.3 - 4.6 mg/dL  MAGNESIUM     Status: None   Collection Time    03/19/14  4:17 AM      Result Value Ref Range   Magnesium 2.3  1.5 - 2.5 mg/dL  TRIGLYCERIDES     Status: None   Collection Time    03/19/14  4:17 AM      Result Value Ref Range   Triglycerides 69  <150 mg/dL  GLUCOSE, CAPILLARY     Status: Abnormal   Collection Time    03/19/14  7:50 AM      Result Value Ref Range   Glucose-Capillary 119 (*) 70 - 99 mg/dL  GLUCOSE, CAPILLARY     Status: Abnormal   Collection Time    03/19/14 11:36 AM      Result Value Ref Range   Glucose-Capillary 145 (*) 70 - 99 mg/dL  GLUCOSE, CAPILLARY     Status: Abnormal   Collection Time    03/19/14  4:50 PM      Result Value Ref Range   Glucose-Capillary 124 (*) 70 - 99 mg/dL  GLUCOSE, CAPILLARY     Status: Abnormal   Collection Time    03/19/14  8:13  PM      Result Value Ref Range   Glucose-Capillary 132 (*) 70 - 99 mg/dL  GLUCOSE, CAPILLARY     Status: Abnormal   Collection Time    03/20/14 12:26 AM      Result Value Ref Range   Glucose-Capillary 152 (*) 70 - 99 mg/dL  GLUCOSE, CAPILLARY     Status: Abnormal   Collection Time    03/20/14  5:10 AM      Result Value Ref Range   Glucose-Capillary 148 (*) 70 - 99 mg/dL  GLUCOSE, CAPILLARY     Status: Abnormal   Collection Time    03/20/14  8:01 AM      Result Value Ref Range   Glucose-Capillary 149 (*) 70 - 99 mg/dL  GLUCOSE, CAPILLARY     Status: Abnormal   Collection Time    03/20/14 11:43 AM      Result Value Ref Range   Glucose-Capillary 152 (*) 70 - 99 mg/dL  GLUCOSE, CAPILLARY     Status: Abnormal   Collection Time    03/20/14  4:01 PM      Result Value Ref Range   Glucose-Capillary 140 (*) 70 - 99 mg/dL     No results found.  Assessment/Plan: 68 y/o with multiple comorbidities, out of hospital cardiac arrest 5/29 with unknown time down, s/p TH 5/31,  and severe anoxic encephalopathy. TH protocol completed> 72 h ago but patient still requiring propofol for sedation and with last serum Cr>3   Previous EEG consistent with encephalopathy. MRI brain pending. Overall prognosis for meaningful functional neurological recovery seems to be poor, but a more reliable prognostication should be furnish when propofol is completely weaned off her body, the metabolic derangement is corrected, MRI brain becomes available, and a follow up EEG off propofol is obtained. Will follow up with you.  Dorian Pod, MD 03/20/2014, 8:16 PM [facility]

## 2014-03-20 NOTE — Progress Notes (Signed)
PULMONARY / CRITICAL CARE MEDICINE   Name: Martha Gross MRN: [id number] DOB: 1946/01/31    ADMISSION DATE:  03/08/2014  PRIMARY SERVICE: PCCM  CHIEF COMPLAINT:   Witnessed cardiac arrest.  BRIEF PATIENT DESCRIPTION:  68 years old morbid obese AA female with PMH relevant for DM, HTN, dyslipidemia, CHF with unknown LVEF, ex smoker, COPD and recent minor ischemic stroke. Presents brought by EMS after sustaining witnessed cardiac arrest at home. Intubated at arrival to the ED. EKG with no evidence of STEMI. Down time unclear. As per the family EMS took a long time to get there, CPR by family member for 30 minutes to an hour, EMS did about 13 minutes and 2 rounds of epi. Initial rhythm was asystole. Patient unresponsive. Pt gagged on ETT tube overnight, was given propofol. Levophed IV given for hypotension refractory to propofol admin.  SIGNIFICANT EVENTS / STUDIES:  5/30- CT scan of the head with no evidence of intracranial bleed.  5/31- hypothermia 5/31 echo - 60=65%, Pa 55, no per effusions, The interventricular septum was D-shaped, suggesting RV pressure/volume overload. 5/31- doppler lowers neg 6/1- renal US>>>-renal US 6/1- poor neuro examination, off all sedation 6/1 eeg>>>severe generalized continuous nonspecific slowing of cerebral activity 6/2- no major changes in neurostatus 6/3- followed commands x 1, required sedation 6/3- TEE>>>neg endocarditis, nml LVEF, dilated/depressed RV 6/4- ?Neurostatus improvement. Much more movement of bil U/L extremities. Non-purposeful. 6/4 MRI brain>>>  LINES / TUBES: - CVL 5/3\0>>> - ETT 5/30>>> - A line 5/30>>>out  CULTURES: - Blood cultures 5/30>>>strep ver - BC 6/1>>>NGTD - Tracheal aspirate culture ordered - Urine culture 5/30>>>E coli  ANTIBIOTICS: - Zosyn 6/1>>>off - Ceftriaxone 6/1>>>  SUBJECTIVE: Patient is sedated, non-agitated.  VITAL SIGNS: Temp:  [92.5 F (33.6 C)-99.5 F (37.5 C)] 97.7 F (36.5 C) (06/04  0700) Pulse Rate:  [66-100] 66 (06/04 0748) Resp:  [15-59] 30 (06/04 0748) BP: (83-165)/(30-90) 119/58 mmHg (06/04 0748) SpO2:  [84 %-100 %] 98 % (06/04 0748) FiO2 (%):  [30 %-50 %] 30 % (06/04 0748) Weight:  [162.388 kg (358 lb)] 162.388 kg (358 lb) (06/04 0400) HEMODYNAMICS: CVP:  [13 mmHg-29 mmHg] 18 mmHg VENTILATOR SETTINGS: Vent Mode:  [-] PRVC FiO2 (%):  [30 %-50 %] 30 % Set Rate:  [30 bmp] 30 bmp Vt Set:  [450 mL] 450 mL PEEP:  [5 cmH20] 5 cmH20 Plateau Pressure:  [24 cmH20-34 cmH20] 25 cmH20 INTAKE / OUTPUT: Intake/Output     06/03 0701 - 06/04 0700 06/04 0701 - 06/05 0700   I.V. (mL/kg) 1973.8 (12.2)    NG/GT 771    IV Piggyback 50    Total Intake(mL/kg) 2794.8 (17.2)    Urine (mL/kg/hr) 255 (0.1)    Total Output 255     Net +2539.8          Stool Occurrence 3 x      PHYSICAL EXAMINATION: General: Obese, AA female. Ill-appearing. Neuro: Sedated. Unable to communicate. Non-purposeful movement of bil U/L extremities noted. Not following commands. HEENT: Obese, ETT. Opening eyes nonpurposefully.  PULM: Breath sounds distant, limited due to body habitus.  CV: S1 S2, RRR, distant due to body habitus. GI: Soft, BS+ , no r/g Extremities: 2+ pitting edema to bil LE   LABS:  PULMONARY  Recent Labs Lab 03/13/2014 2248 03/15/14 0200 03/15/14 0434 03/15/14 0613 03/15/14 0750 03/15/14 0854 03/17/14 0906 03/18/14 0320  PHART 7.282*  --  7.468*  --   --   --  7.385 7.380  PCO2ART 50.1*  --  33.6*  --   --   --  45.1* 44.1  PO2ART 72.0*  --  157.0*  --   --   --  69.0* 94.0  HCO3 24.2*  --  24.9*  --   --   --  27.0* 25.5*  TCO2 26  --  26 23  --  22 28 26.9  O2SAT 94.0 63.3 100.0  --  73.1  --  93.0 98.7    CBC  Recent Labs Lab 03/17/14 0400 03/18/14 0400 03/19/14 0417  HGB 10.9* 8.8* 9.2*  HCT 33.2* 27.1* 27.3*  WBC 13.0* 9.1 10.0  PLT 140* 96* 113*    COAGULATION  Recent Labs Lab 03/11/2014 2152 03/15/14 0230 03/16/14 0551  INR 1.78* 1.77*  1.67*    CARDIAC    Recent Labs Lab 03/10/2014 2158 03/15/14 1006 03/15/14 1848 03/16/14 0247  TROPONINI <0.30 <0.30 <0.30 <0.30    Recent Labs Lab 03/16/14 0551  PROBNP 3139.0*     CHEMISTRY  Recent Labs Lab 03/15/14 1600  03/16/14 0551  03/16/14 1607 03/16/14 2359 03/17/14 0400 03/18/14 0400 03/19/14 0417  NA 139  < > 141  < > 140 142 142 142 144  K 4.2  < > 3.7  < > 3.7 3.8 4.1 4.2 4.9  CL 100  < > 99  < > 99 101 101 102 102  CO2 21  < > 21  < > 22 23 24 24 27   GLUCOSE 147*  < > 166*  < > 172* 154* 147* 159* 198*  BUN 45*  < > 46*  < > 47* 49* 50* 56* 69*  CREATININE 2.21*  < > 2.42*  < > 2.55* 2.83* 2.92* 3.36* 3.84*  CALCIUM 9.3  < > 9.3  < > 9.1 9.1 9.1 8.6 9.1  MG 2.1  --  2.1  --   --   --  2.2 2.1 2.3  PHOS 4.9*  --  5.2*  --   --   --  5.4* 5.7* 5.9*  < > = values in this interval not displayed. Estimated Creatinine Clearance: 23.2 ml/min (by C-G formula based on Cr of 3.84).   LIVER  Recent Labs Lab 03/08/2014 2152 03/15/14 0230 03/16/14 0551  AST 57*  --   --   ALT 28  --   --   ALKPHOS 114  --   --   BILITOT 2.5*  --   --   PROT 7.7  --   --   ALBUMIN 3.3*  --   --   INR 1.78* 1.77* 1.67*     INFECTIOUS  Recent Labs Lab 03/15/14 1007 03/16/14 0552 03/17/14 0500  LATICACIDVEN 2.9* 3.2* 2.1     ENDOCRINE CBG (last 3)   Recent Labs  03/19/14 2013 03/20/14 0026 03/20/14 0510  GLUCAP 132* 152* 148*     IMAGING x48h  No results found.  ASSESSMENT / PLAN:  PULMONARY A: 1) Acute hypoxemic respiratory failure post cardiac arrest. 2) Questionable aspiration  3) History of COPD 4) Pulm edema 5) likely OSA undx  P:   - PEEP 5, 30%, RR 30 TV , -reduce propofol and wean cpap5 ps 5 as goal x 2 hrs - May require early trach with ? Improved neurostatus - PCXR assess in am for volume status -pcxr needed now  CARDIOVASCULAR A:  1) Cardiac arrest, unclear etiology, differential includes primary cardiac event,  hypercarbic failure, PE 3) QTc prolonged R/o endocarditis P:  - Tele - Goal  MAP 55-60 - Maintain ebx - TEE completed>>> neg endocarditis - TKO IVF - Consider restart Lasix  RENAL A:   1) Acute renal failure, chronic?, ATN likely P:   - Consider restart Lasix due to fluid overload - Rpt BMet, monitor SrCr - KVO IVF  GASTROINTESTINAL A:   1) Tube feeds P:   - GI prophylaxis with protonix - TF  - Assess last BM  HEMATOLOGIC A:   1) DVt prevention  P:  - DVT prophylaxis with SQ heparin - Follow CBC  INFECTIOUS A:   1) Bilateral lung infiltrates. Asp vs edema, UTI, r/o contamination BC 2) UTI P:   - Ceftriaxone maintain per ID, dose increased stop date determined, appreciate this rec -TEE neg for vegetation - Follow CBC - ID consult>>>cont w/ ceftriaxone 2gm IV x 14 days, follow rpt BC, Ecoli covered   ENDOCRINE A:   1) DM on insulin 2) Hypoglycemic improved P:   - SSI  NEUROLOGIC A:   1) Post cardiac arrest. anoxic brain injury P:   - May have improved neurostatus - Propofol restarted due to agitation/HTN, WUA _mri brain for anoxia, prognostication -neuro consult  Global:  Patient is unable to follow commands.  Non-purposeful movement of bilateral U/L extremities noted.  Opening eyes nonpurposefully. Propofol restarted after WUA due to agitation/inc in BP. TEE completed and neg for vegetation.  ID consulted and recommends continuing ceftriaxone x 14 days.  MRI scheduled for today.  Elenore PaddyStephanie M. Reese, PA-S  Ccm time 30 min  I have fully examined this patient and agree with above findings.    And edite dinfull  Mcarthur Rossettianiel J. Tyson AliasFeinstein, MD, FACP Pgr: [phone] [facility]

## 2014-03-20 NOTE — Progress Notes (Addendum)
Patient Name: Martha Gross Date of Encounter: 03/20/2014  Active Problems:   Cardiac arrest   Acute respiratory failure with hypoxia   Acute on chronic renal failure   Coma   Acute encephalopathy   Length of Stay: 6  SUBJECTIVE  Moves head, opens eyes and breathing/coughing above vent. Not following commands. Afebrile. Hemodynamically compensated, maintaining normal rhythm.   CURRENT MEDS . antiseptic oral rinse  15 mL Mouth Rinse QID  . cefTRIAXone (ROCEPHIN)  IV  2 g Intravenous Q24H  . chlorhexidine  15 mL Mouth Rinse BID  . feeding supplement (PRO-STAT SUGAR FREE 64)  60 mL Per Tube BID  . feeding supplement (VITAL HIGH PROTEIN)  1,000 mL Per Tube Q24H  . heparin subcutaneous  5,000 Units Subcutaneous 3 times per day  . insulin aspart  2-6 Units Subcutaneous 6 times per day  . ipratropium-albuterol  3 mL Nebulization Q4H  . mupirocin ointment  1 application Nasal BID  . pantoprazole (PROTONIX) IV  40 mg Intravenous Daily  . THROMBI-PAD  1 each Topical Once    OBJECTIVE   Intake/Output Summary (Last 24 hours) at 03/20/14 0810 Last data filed at 03/20/14 0700  Gross per 24 hour  Intake 2551.62 ml  Output    255 ml  Net 2296.62 ml   Filed Weights   03/18/14 0247 03/19/14 0500 03/20/14 0400  Weight: 338 lb (153.316 kg) 339 lb (153.769 kg) 358 lb (162.388 kg)    PHYSICAL EXAM Filed Vitals:   03/20/14 0600 03/20/14 0630 03/20/14 0700 03/20/14 0748  BP: 94/52 104/64 108/62 119/58  Pulse: 70 68 68 66  Temp: 98.1 F (36.7 C) 97.9 F (36.6 C) 97.7 F (36.5 C)   TempSrc:      Resp: 30 30 34 30  Height:      Weight:      SpO2: 100% 99% 97% 98%   General: moving head/eyes/hands, not clearly purposeful, on ventilator  Head: no evidence of trauma, PERRL, EOMI, no exophtalmos or lid lag, no myxedema, no xanthelasma; normal ears, nose and oropharynx  Neck: cannot evaluate jugular venous pulsations or hepatojugular reflux; brisk carotid pulses without delay and no  carotid bruits  Chest: clear to auscultation, no signs of consolidation by percussion or palpation, normal fremitus, symmetrical and full respiratory excursions  Cardiovascular: cannot locate the apical impulse, regular rhythm, normal first and second heart sounds, no rubs or gallops, no murmur  Abdomen: no tenderness or distention, no masses by palpation, no abnormal pulsatility or arterial bruits, normal bowel sounds, no hepatosplenomegaly  Extremities: no clubbing, cyanosis, 2+ limb edema; 2+ radial, ulnar and brachial pulses bilaterally; 2+ right femoral, posterior tibial and dorsalis pedis pulses; 2+ left femoral, posterior tibial and dorsalis pedis pulses; no subclavian or femoral bruits  Neurological: not following commands   LABS  CBC  Recent Labs  03/18/14 0400 03/19/14 0417  WBC 9.1 10.0  NEUTROABS 6.9 6.9  HGB 8.8* 9.2*  HCT 27.1* 27.3*  MCV 80.4 78.4  PLT 96* 113*   Basic Metabolic Panel  Recent Labs  03/18/14 0400 03/19/14 0417  NA 142 144  K 4.2 4.9  CL 102 102  CO2 24 27  GLUCOSE 159* 198*  BUN 56* 69*  CREATININE 3.36* 3.84*  CALCIUM 8.6 9.1  MG 2.1 2.3  PHOS 5.7* 5.9*   Liver Function Tests No results found for this basename: AST, ALT, ALKPHOS, BILITOT, PROT, ALBUMIN,  in the last 72 hours No results found for this basename: LIPASE, AMYLASE,  in the last 72 hours Cardiac Enzymes No results found for this basename: CKTOTAL, CKMB, CKMBINDEX, TROPONINI,  in the last 72 hours BNP No components found with this basename: POCBNP,  D-Dimer No results found for this basename: DDIMER,  in the last 72 hours Hemoglobin A1C No results found for this basename: HGBA1C,  in the last 72 hours Fasting Lipid Panel  Recent Labs  03/19/14 0417  TRIG 69   Thyroid Function Tests  Recent Labs  03/17/14 1025  TSH 1.970    Radiology Studies Imaging results have been reviewed and No results found.  TELE NSR  ECG NSR  ASSESSMENT AND PLAN  TEE did not  show evidence of vegetations (S.viridans bacteremia). In absence of fever or elevation in WBC, endocarditis seems unlikely. TEE did confirm normal LV systolic function, dilated right heart chambers and depressed RV function. Elevated LV pressures by Doppler parameters. Estimated systolic PA pressure around 45-50 mm Hg (assuming RA pressure 20 mm Hg). Cor pulmonale is the major underlying cardiac pathology with a lesser component of LV diastolic dysfunction How this all ties in with her arrest remains unclear. Prognosis remains very poor, primarily due to low likelihood of meaningful neurological recovery. She is markedly hypervolemic. Will DC IV fluids Waiting for labs before diuretic prescription. Please reconsult Cardiology if needed.   Thurmon FairMihai Cesareo Vickrey, MD, [facility] [phone] office [phone] pager 03/20/2014 8:10 AM

## 2014-03-21 ENCOUNTER — Inpatient Hospital Stay (HOSPITAL_COMMUNITY): Payer: Medicare Other

## 2014-03-21 ENCOUNTER — Other Ambulatory Visit (HOSPITAL_COMMUNITY): Payer: Medicare Other

## 2014-03-21 LAB — BASIC METABOLIC PANEL
BUN: 79 mg/dL — ABNORMAL HIGH (ref 6–23)
CO2: 24 meq/L (ref 19–32)
Calcium: 8.7 mg/dL (ref 8.4–10.5)
Chloride: 102 mEq/L (ref 96–112)
Creatinine, Ser: 4.35 mg/dL — ABNORMAL HIGH (ref 0.50–1.10)
GFR calc Af Amer: 11 mL/min — ABNORMAL LOW (ref >90)
GFR calc non Af Amer: 10 mL/min — ABNORMAL LOW (ref >90)
Glucose, Bld: 113 mg/dL — ABNORMAL HIGH (ref 70–99)
Potassium: 4.6 mEq/L (ref 3.7–5.3)
SODIUM: 143 meq/L (ref 137–147)

## 2014-03-21 LAB — GLUCOSE, CAPILLARY
GLUCOSE-CAPILLARY: 131 mg/dL — AB (ref 70–99)
GLUCOSE-CAPILLARY: 96 mg/dL (ref 70–99)
Glucose-Capillary: 95 mg/dL (ref 70–99)

## 2014-03-21 LAB — HEPATIC FUNCTION PANEL
ALBUMIN: 2.5 g/dL — AB (ref 3.5–5.2)
ALT: 26 U/L (ref 0–35)
AST: 32 U/L (ref 0–37)
Alkaline Phosphatase: 84 U/L (ref 39–117)
Bilirubin, Direct: 0.6 mg/dL — ABNORMAL HIGH (ref 0.0–0.3)
Indirect Bilirubin: 0.4 mg/dL (ref 0.3–0.9)
Total Bilirubin: 1 mg/dL (ref 0.3–1.2)
Total Protein: 6.6 g/dL (ref 6.0–8.3)

## 2014-03-21 LAB — CULTURE, BLOOD (ROUTINE X 2): Culture: NO GROWTH

## 2014-03-21 MED ORDER — METOLAZONE 5 MG PO TABS
5.0000 mg | ORAL_TABLET | Freq: Two times a day (BID) | ORAL | Status: DC
  Filled 2014-03-21 (×2): qty 1

## 2014-03-21 MED ORDER — PROPOFOL 10 MG/ML IV EMUL
INTRAVENOUS | Status: AC
  Filled 2014-03-21: qty 100

## 2014-03-21 MED ORDER — METOCLOPRAMIDE HCL 5 MG/ML IJ SOLN
5.0000 mg | Freq: Two times a day (BID) | INTRAMUSCULAR | Status: DC
  Filled 2014-03-21 (×2): qty 1

## 2014-03-21 MED ORDER — MIDAZOLAM HCL 2 MG/2ML IJ SOLN: 2.0000 mg | INTRAMUSCULAR | Status: DC | PRN

## 2014-03-21 MED ORDER — FUROSEMIDE 10 MG/ML IJ SOLN
160.0000 mg | Freq: Three times a day (TID) | INTRAVENOUS | Status: DC
  Filled 2014-03-21 (×3): qty 16

## 2014-03-21 MED ORDER — MORPHINE SULFATE 10 MG/ML IJ SOLN
10.0000 mg/h | INTRAVENOUS | Status: DC
  Administered 2014-03-21: 10 mg/h via INTRAVENOUS
  Filled 2014-03-21: qty 10

## 2014-03-21 MED ORDER — MORPHINE BOLUS VIA INFUSION
5.0000 mg | INTRAVENOUS | Status: DC | PRN
  Filled 2014-03-21: qty 20

## 2014-03-23 LAB — CULTURE, BLOOD (ROUTINE X 2)
Culture: NO GROWTH
Culture: NO GROWTH

## 2014-03-27 NOTE — Discharge Summary (Addendum)
NAME:  AMAZING, COOLBAUGH NO.:  [id number]  MEDICAL RECORD NO.:  [id number]  LOCATION:  2H01C                        FACILITY:  [facility]  PHYSICIAN:  Nelda Bucks, MD DATE OF BIRTH:  1946/01/09  DATE OF ADMISSION:  03/09/2014 DATE OF DISCHARGE:  2014/03/24                              DISCHARGE SUMMARY   DEATH SUMMARY  This is a 68 year old, morbidly obese, African American female with past medical history relevant for diabetes, hypertension, dyslipidemia, CHF with unknown ejection fraction at the time of admission, ex-smoker with COPD, and a recent minor ischemic stroke who basically presented by EMS after sustaining witnessed cardiac arrest at home, intubated upon arrival to the emergency room.  EKG with no evidence of ST-segment elevation or significant ST-segment changes.  Down time was unclear. CPR by family members for 30 minutes to an hour as possible.  HOSPITAL COURSE:  The patient's hospital course was significant for CAT scan of the head which showed no evidence of intracranial bleed.  On Mar 16, 2014, the patient underwent hypothermia for cardiac arrest to try to improve her neurologic outcome at 5:31.  She had an echocardiogram showed ejection fraction of 60%-65% with PA pressures of 55.  No pericardial effusion.  There was RV overload likely acute on chronic in nature.  Dopplers of lower extremities were negative n Mar 16, 2014. Renal ultrasound because of renal failure on March 17, 2014, negative for hydronephrosis.  The patient continued to have poor neurologic examination essentially throughout her entire hospitalization, did have a consultation by Dr. Thana Farr was super helpful in helping [place] with prognostication.  The patient also had blood cultures turned out to be positive for strep viridans, although TEE was performed on June 3, which did not show any endocarditis, had normal ejection fraction with dilated and depressed RV  function.  IMPRESSION:  The patient continued to do poorly, worsening renal failure, worsening edema, some increasing ventilatory requirements, and no change in neurologic outcome or prognosis.  Extensive discussions were held with the entire family.  Comfort care was decided, and the patient had discontinued her life support and expired.  FINAL DIAGNOSES: 1. Status post cardiac arrest. 2. Anoxic brain injury. 3. History of chronic obstructive pulmonary disease, acute respiratory     failure. 4. Negative for deep venous thrombosis. 5. Acute renal failure. 6. QTc prolongation. 7. 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 8. Acute cor pulmonale       Nelda Bucks, MD     DJF/MEDQ  D:  03/26/2014  T:  03/27/2014  Job:  [id number]

## 2014-04-16 NOTE — Progress Notes (Signed)
PULMONARY / CRITICAL CARE MEDICINE   Name: Larry SierrasMary Claxton MRN: [id number] DOB: 04/02/1946    ADMISSION DATE:  02/21/2014  PRIMARY SERVICE: PCCM  CHIEF COMPLAINT:   Witnessed cardiac arrest.  BRIEF PATIENT DESCRIPTION:  68 years old morbid obese AA female with PMH relevant for DM, HTN, dyslipidemia, CHF with unknown LVEF, ex smoker, COPD and recent minor ischemic stroke. Presents brought by EMS after sustaining witnessed cardiac arrest at home. Intubated at arrival to the ED. EKG with no evidence of STEMI. Down time unclear. As per the family EMS took a long time to get there, CPR by family member for 30 minutes to an hour, EMS did about 13 minutes and 2 rounds of epi. Initial rhythm was asystole. Patient unresponsive. Pt gagged on ETT tube overnight, was given propofol. Levophed 2mcg IV given for hypotension refractory to propofol admin. 6/4 pm patient vomited tube feeds. Neuro saw and were not impressed with neurostatus. Taken to MRI which was not done due to patient's body habitus.  SIGNIFICANT EVENTS / STUDIES:  5/30- CT scan of the head with no evidence of intracranial bleed.  5/31- hypothermia 5/31 echo - 60=65%, Pa 55, no per effusions, The interventricular septum was D-shaped, suggesting RV pressure/volume overload. 5/31- doppler lowers neg 6/1- renal US>>>-renal US 6/1- poor neuro examination, off all sedation 6/1 eeg>>>severe generalized continuous nonspecific slowing of cerebral activity 6/2- no major changes in neurostatus 6/3- followed commands x 1, required sedation 6/3- TEE>>>neg endocarditis, nml LVEF, dilated/depressed RV 6/4- ?Neurostatus improvement. Much more movement of bil U/L extremities. Non-purposeful. 6/4 MRI brain>>>not completed due to patient's body habitus. 6/5 Vomiting tube feeds overnight. No neurostatus improvement.  LINES / TUBES: - CVL 5/3\0>>> - ETT 5/30>>> - A line 5/30>>>out  CULTURES: - Blood cultures 5/30>>>strep ver - BC 6/1>>>NGTD - Tracheal  aspirate culture ordered - Urine culture 5/30>>>E coli  ANTIBIOTICS: - Zosyn 6/1>>>off - Ceftriaxone 6/1>>>Plan to 6/12 per ID  SUBJECTIVE: Patient is sedated, non-agitated. Vomited tube feeds overnight.  VITAL SIGNS: Temp:  [98.1 F (36.7 C)-99.1 F (37.3 C)] 99 F (37.2 C) (06/05 0400) Pulse Rate:  [69-96] 72 (06/05 0731) Resp:  [21-57] 30 (06/05 0731) BP: (71-163)/(33-106) 71/33 mmHg (06/05 0731) SpO2:  [88 %-100 %] 96 % (06/05 0731) FiO2 (%):  [30 %] 30 % (06/05 0731) Weight:  [162.388 kg (358 lb)] 162.388 kg (358 lb) (06/05 0354) HEMODYNAMICS: CVP:  [18 mmHg-26 mmHg] 26 mmHg VENTILATOR SETTINGS: Vent Mode:  [-] PRVC FiO2 (%):  [30 %] 30 % Set Rate:  [30 bmp] 30 bmp Vt Set:  [450 mL] 450 mL PEEP:  [5 cmH20] 5 cmH20 Plateau Pressure:  [27 cmH20-32 cmH20] 27 cmH20 INTAKE / OUTPUT: Intake/Output     06/04 0701 - 06/05 0700 06/05 0701 - 06/06 0700   I.V. (mL/kg) 903 (5.6)    NG/GT 855    IV Piggyback 50    Total Intake(mL/kg) 1808 (11.1)    Urine (mL/kg/hr) 380 (0.1)    Total Output 380     Net +1428            PHYSICAL EXAMINATION: General: Obese, AA female. Ill-appearing. Neuro: Sedated. Unable to communicate. Non-purposeful movement of bil U/L extremities noted. Not following commands. HEENT: Obese, ETT. Opening eyes occasioanlly and nonpurposefully.  PULM: Breath sounds distant, limited due to body habitus.  CV: S1 S2, RRR, distant due to body habitus. Pressures variable. GI: Soft, BS+ , no r/g Extremities: 2+ pitting edema to bil LE.   LABS:  PULMONARY  Recent Labs Lab 03/09/2014 2248 03/15/14 0200 03/15/14 0434 03/15/14 0613 03/15/14 0750 03/15/14 0854 03/17/14 0906 03/18/14 0320  PHART 7.282*  --  7.468*  --   --   --  7.385 7.380  PCO2ART 50.1*  --  33.6*  --   --   --  45.1* 44.1  PO2ART 72.0*  --  157.0*  --   --   --  69.0* 94.0  HCO3 24.2*  --  24.9*  --   --   --  27.0* 25.5*  TCO2 26  --  26 23  --  22 28 26.9  O2SAT 94.0 63.3 100.0   --  73.1  --  93.0 98.7    CBC  Recent Labs Lab 03/17/14 0400 03/18/14 0400 03/19/14 0417  HGB 10.9* 8.8* 9.2*  HCT 33.2* 27.1* 27.3*  WBC 13.0* 9.1 10.0  PLT 140* 96* 113*    COAGULATION  Recent Labs Lab 02/21/2014 2152 03/15/14 0230 03/16/14 0551  INR 1.78* 1.77* 1.67*    CARDIAC    Recent Labs Lab 03/10/2014 2158 03/15/14 1006 03/15/14 1848 03/16/14 0247  TROPONINI <0.30 <0.30 <0.30 <0.30    Recent Labs Lab 03/16/14 0551  PROBNP 3139.0*     CHEMISTRY  Recent Labs Lab 03/15/14 1600  03/16/14 0551  03/16/14 2359 03/17/14 0400 03/18/14 0400 03/19/14 0417 03/28/2014 0340  NA 139  < > 141  < > 142 142 142 144 143  K 4.2  < > 3.7  < > 3.8 4.1 4.2 4.9 4.6  CL 100  < > 99  < > 101 101 102 102 102  CO2 21  < > 21  < > 23 24 24 27 24   GLUCOSE 147*  < > 166*  < > 154* 147* 159* 198* 113*  BUN 45*  < > 46*  < > 49* 50* 56* 69* 79*  CREATININE 2.21*  < > 2.42*  < > 2.83* 2.92* 3.36* 3.84* 4.35*  CALCIUM 9.3  < > 9.3  < > 9.1 9.1 8.6 9.1 8.7  MG 2.1  --  2.1  --   --  2.2 2.1 2.3  --   PHOS 4.9*  --  5.2*  --   --  5.4* 5.7* 5.9*  --   < > = values in this interval not displayed. Estimated Creatinine Clearance: 20.5 ml/min (by C-G formula based on Cr of 4.35).   LIVER  Recent Labs Lab 03/10/2014 2152 03/15/14 0230 03/16/14 0551  AST 57*  --   --   ALT 28  --   --   ALKPHOS 114  --   --   BILITOT 2.5*  --   --   PROT 7.7  --   --   ALBUMIN 3.3*  --   --   INR 1.78* 1.77* 1.67*     INFECTIOUS  Recent Labs Lab 03/15/14 1007 03/16/14 0552 03/17/14 0500  LATICACIDVEN 2.9* 3.2* 2.1     ENDOCRINE CBG (last 3)   Recent Labs  03/20/14 1949 03/20/14 2330 03/18/2014 0401  GLUCAP 128* 131* 96     IMAGING x48h  No results found.  ASSESSMENT / PLAN:  PULMONARY A: 1) Acute hypoxemic respiratory failure post cardiac arrest. 2) Questionable aspiration  3) History of COPD 4) Pulm edema 5) likely OSA undx  P:   - PEEP 5, 30%, RR 30  TV , - Plan to reduce propofol and wean CPAP 5 PS 5 as goal x 2 hrs -  trach likely medically ineffective, will plan meeting today 1pm = consider comfort - Rpt PCXR 6/5>>>awaited, ordered several times, not done, unclear, reorder  CARDIOVASCULAR A:  1) Cardiac arrest, unclear etiology, differential includes primary cardiac event, hypercarbic failure, PE 2) QTc prolonged 3) Cor pulmonale R/o endocarditis P:  - Tele - Goal MAP 55-60 - Maintain ebx - TEE completed>>> neg endocarditis - TKO IVF -increase lasix, remains pos  RENAL A:   1) Acute renal failure, chronic?, ATN likely P:   -escalate lasix, add metalazone, HD would be medically futile - Rpt BMet, monitor SrCr - KVO IVF  GASTROINTESTINAL A:   1) Tube feeds P:   - GI prophylaxis with protonix - TF held , add reglam, rstart trickle  HEMATOLOGIC A:   1) DVt prevention  P:  - DVT prophylaxis with SQ heparin - Follow CBC Plat improved  INFECTIOUS A:   1) Bilateral lung infiltrates. Asp vs edema, UTI, r/o contamination BC (tee neg) 2) UTI P:   - Ceftriaxone maintain to 6/12 per ID, dose increased stop date determined, appreciate this rec -TEE neg for vegetation - Follow CBC - ID consult>>>cont w/ ceftriaxone 2gm IV x 14 days, follow rpt BC, Ecoli covered   ENDOCRINE A:   1) DM on insulin 2) Hypoglycemic improved P:   - SSI  NEUROLOGIC A:   1) Post cardiac arrest. anoxic brain injury P:   - No significant improvement in neurostatus - Propofol restarted due to agitation/HTN, WUA - MRI brain for anoxia, prognostication>>> not completed due to patient's body habitus - Appreciate Neuro consult>>>EEG consistent with encephalopathy, poor prognosis for meaningful functional neurological recovery - Neuro will follow up. Reassess once pt is off propofol/EEG rpt, MRI of brain available  Global:  Patient is still unable to follow commands.  Occasional non-purposeful movement of bilateral U/L extremities  noted.  Opening eyes nonpurposefully. Still requiring propofol for sedation. MRI scheduled 6/4 not completed due to patient's body habitus. Neuro saw the patient and were not impressed with neurostatus. Neuro will follow up and potentially reassess once patient is off propofol and able to receive MRI.  Given co morbids, MODS, lack of progress, will have meeting to consider comfort care  Elenore Paddy. Reese, PA-S  Ccm time 35 min   I have fully examined this patient and agree with above findings.      [name]. [name], MD, FACP Pgr: [phone] [facility]

## 2014-04-16 NOTE — Progress Notes (Signed)
Patient ID: Martha Gross, female   DOB: May 31, 1946, 68 y.o.   MRN: [id number]  Extensive discussion with family , daughter, 4 sinblings. We discussed the poor prognosis and likely poor quality of life. Family has decided to offer full comfort care. They are aware that the patient may be transferred to palliative care floor for continued comfort care needs. They have been fully updated on the process and expectations.  [name]. [name], MD, FACP Pgr: [phone] [facility]

## 2014-04-16 NOTE — Progress Notes (Addendum)
Expiration Note:  Patient expired @ 1802 with family at bedside.  [facility] notified.  Patient without heart rhythm and breath sounds via auscultation x1 full minute per Little Ishikawa, R.N. and Gaspar Garbe, R.N.    No breath sounds or heart sounds auscultated for one full minute - Little Ishikawa, R.N.

## 2014-04-16 NOTE — Progress Notes (Addendum)
Clinical Social Work Department BRIEF PSYCHOSOCIAL ASSESSMENT 04/09/2014  Patient:  Martha Gross     Account Number:  [id number]     Admit date:  03/13/2014  Clinical Social Worker:  Varney Biles  Date/Time:  04/02/2014 03:28 PM  Referred by:  Physician  Date Referred:  03/20/2014 Referred for  Other - See comment   Other Referral:   Comfort care   Interview type:  Family Other interview type:    PSYCHOSOCIAL DATA Living Status:  FAMILY Admitted from facility:   Level of care:   Primary support name:  Prudencio Burly ([phone]) Primary support relationship to patient:  CHILD, ADULT Degree of support available:   Good--pt's sisters and niece in the room during assessment.    CURRENT CONCERNS Current Concerns  Other - See comment   Other Concerns:   Comfort care    SOCIAL WORK ASSESSMENT / PLAN CSW provided emotional support to pt's family members in room. Pt's sisters and niece in room when CSW came to provide support; brother stepping out of the room when CSW arrived. Pt to be extubated.   Assessment/plan status:  No Further Intervention Needed Other assessment/ plan:   Information/referral to community resources:   Emotional support    PATIENT'S/FAMILY'S RESPONSE TO PLAN OF CARE: Good--pt's sister engaged in conversation with CSW, thanked CSW for support. CSW signing off.       Maryclare Labrador, MSW, [facility] Clinical Social Worker [phone]

## 2014-04-16 NOTE — Progress Notes (Signed)
Nursing: Prior to giving patient a bath it was observed that the patient began vomiting partial digested tube feeding from her mouth. This was occuring frequently that the tube feeding was turned off and the oral gastric tube was placed to low wall intermittent suction. The Elink button pushed and Niki from [facility] was notified of findings and that the tube feeding was currently off at present time.

## 2014-04-16 NOTE — Progress Notes (Signed)
A request was made by a family member for a chaplin's visit. Chaplin visited on this day and found three of the pt siblings in attendance. Chaplain provided support and prayer for the family. The pt was unable to participate in prayer.   Cindie Crumbly, [place]

## 2014-04-16 NOTE — Progress Notes (Addendum)
Subjective: Patient remains on sedation.  Unresponsive. Unable to have an MRI of the brain secondary to obesity.  EEG off sedation will be difficult as well since patient agitated off of sedation with a lot of thrashing of extremities and head per nursing staff.    Objective: Current vital signs: BP 116/51  Pulse 75  Temp(Src) 99.6 F (37.6 C) (Oral)  Resp 30  Ht 5\' 7"  (1.702 m)  Wt 162.388 kg (358 lb)  BMI 56.06 kg/m2  SpO2 97% Vital signs in last 24 hours: Temp:  [98.1 F (36.7 C)-99.6 F (37.6 C)] 99.6 F (37.6 C) (06/05 0800) Pulse Rate:  [69-96] 75 (06/05 0800) Resp:  [21-57] 30 (06/05 0800) BP: (71-124)/(33-72) 116/51 mmHg (06/05 0800) SpO2:  [88 %-100 %] 97 % (06/05 0800) FiO2 (%):  [30 %] 30 % (06/05 0800) Weight:  [162.388 kg (358 lb)] 162.388 kg (358 lb) (06/05 0354)  Intake/Output from previous day: 06/04 0701 - 06/05 0700 In: 1855.6 [I.V.:950.6; NG/GT:855; IV Piggyback:50] Out: 395 [Urine:395] Intake/Output this shift: Total I/O In: 100 [I.V.:20; NG/GT:30; IV Piggyback:50] Out: 15 [Urine:15] Nutritional status: NPO  Neurologic Exam: Mental Status: Patient does not respond to verbal stimuli.  Does not respond to deep sternal rub.  Does not follow commands.  No verbalizations noted.  Cranial Nerves: II: patient does not respond confrontation bilaterally, pupils right 3 mm, left 3 mm,and reactive bilaterally III,IV,VI: doll's response absent bilaterally.  V,VII: corneal reflex absent bilaterally  VIII: patient does not respond to verbal stimuli IX,X: gag reflex reduced, XI: trapezius strength unable to test bilaterally XII: tongue strength unable to test Motor: Extremities flaccid throughout.  No spontaneous movement noted.  No purposeful movements noted. Sensory: Does not respond to noxious stimuli in any extremity. Deep Tendon Reflexes:  Absent throughout. Plantars: mute bilaterally Cerebellar: Unable to perform    Lab Results: Basic Metabolic  Panel:  Recent Labs Lab 03/15/14 1600  03/16/14 0551  03/16/14 2359 03/17/14 0400 03/18/14 0400 03/19/14 0417 April 09, 2014 0340  NA 139  < > 141  < > 142 142 142 144 143  K 4.2  < > 3.7  < > 3.8 4.1 4.2 4.9 4.6  CL 100  < > 99  < > 101 101 102 102 102  CO2 21  < > 21  < > 23 24 24 27 24   GLUCOSE 147*  < > 166*  < > 154* 147* 159* 198* 113*  BUN 45*  < > 46*  < > 49* 50* 56* 69* 79*  CREATININE 2.21*  < > 2.42*  < > 2.83* 2.92* 3.36* 3.84* 4.35*  CALCIUM 9.3  < > 9.3  < > 9.1 9.1 8.6 9.1 8.7  MG 2.1  --  2.1  --   --  2.2 2.1 2.3  --   PHOS 4.9*  --  5.2*  --   --  5.4* 5.7* 5.9*  --   < > = values in this interval not displayed.  Liver Function Tests:  Recent Labs Lab 03/15/2014 2152  AST 57*  ALT 28  ALKPHOS 114  BILITOT 2.5*  PROT 7.7  ALBUMIN 3.3*   No results found for this basename: LIPASE, AMYLASE,  in the last 168 hours No results found for this basename: AMMONIA,  in the last 168 hours  CBC:  Recent Labs Lab 03/04/2014 2152  03/15/14 0854 03/16/14 0551 03/17/14 0400 03/18/14 0400 03/19/14 0417  WBC 11.7*  --   --  10.3 13.0* 9.1 10.0  NEUTROABS 10.4*  --   --  8.7* 10.2* 6.9 6.9  HGB 10.9*  < > 12.9 10.4* 10.9* 8.8* 9.2*  HCT 33.5*  < > 38.0 31.5* 33.2* 27.1* 27.3*  MCV 82.1  --   --  79.5 79.4 80.4 78.4  PLT 190  --   --  129* 140* 96* 113*  < > = values in this interval not displayed.  Cardiac Enzymes:  Recent Labs Lab 04-07-14 2158 03/15/14 1006 03/15/14 1848 03/16/14 0247  TROPONINI <0.30 <0.30 <0.30 <0.30    Lipid Panel:  Recent Labs Lab 03/16/14 0551 03/19/14 0417  TRIG 163* 69    CBG:  Recent Labs Lab 03/20/14 1601 03/20/14 1949 03/20/14 2330 03/20/2014 0401 03/20/2014 0824  GLUCAP 140* 128* 131* 96 95    Microbiology: Results for orders placed during the hospital encounter of 2014-04-07  CULTURE, BLOOD (ROUTINE X 2)     Status: None   Collection Time    04/07/2014 10:10 PM      Result Value Ref Range Status   Specimen  Description BLOOD LEFT ARM   Final   Special Requests BOTTLES DRAWN AEROBIC AND ANAEROBIC 5CC   Final   Culture  Setup Time     Final   Value: 03/15/2014 03:53     Performed at [facility]   Culture     Final   Value: VIRIDANS STREPTOCOCCUS     Note: THE SIGNIFICANCE OF ISOLATING THIS ORGANISM FROM A SINGLE SET OF BLOOD CULTURES WHEN MULTIPLE SETS ARE DRAWN IS UNCERTAIN. PLEASE NOTIFY THE MICROBIOLOGY DEPARTMENT WITHIN ONE WEEK IF SPECIATION AND SENSITIVITIES ARE REQUIRED.     Note: Gram Stain Report Called to,Read Back By and Verified With: PAM ALVORD 03/16/14 @ 10:48AM BY RUSCOE A.     Performed at [facility]   Report Status 03/17/2014 FINAL   Final  URINE CULTURE     Status: None   Collection Time    2014/04/07 10:30 PM      Result Value Ref Range Status   Specimen Description URINE, CATHETERIZED   Final   Special Requests NONE   Final   Culture  Setup Time     Final   Value: 03/15/2014 03:58     Performed at [facility]   Colony Count     Final   Value: >=100,000 COLONIES/ML     Performed at [facility]   Culture     Final   Value: ESCHERICHIA COLI     Performed at [facility]   Report Status 03/17/2014 FINAL   Final   Organism ID, Bacteria ESCHERICHIA COLI   Final  CULTURE, BLOOD (ROUTINE X 2)     Status: None   Collection Time    April 07, 2014 10:45 PM      Result Value Ref Range Status   Specimen Description BLOOD RIGHT HAND   Final   Special Requests BOTTLES DRAWN AEROBIC ONLY 1CC   Final   Culture  Setup Time     Final   Value: 03/15/2014 03:53     Performed at [facility]   Culture     Final   Value: NO GROWTH 5 DAYS     Performed at [facility]   Report Status 03/20/2014 FINAL   Final  MRSA PCR SCREENING     Status: Abnormal   Collection Time    03/15/14 12:48 AM      Result Value Ref Range Status  MRSA by PCR POSITIVE (*) NEGATIVE Final   Comment:            The GeneXpert MRSA Assay (FDA     approved  for NASAL specimens     only), is one component of a     comprehensive MRSA colonization     surveillance program. It is not     intended to diagnose MRSA     infection nor to guide or     monitor treatment for     MRSA infections.     RESULT CALLED TO, READ BACK BY AND VERIFIED WITH:     CALLED TO RN MAE Wardell Heath [id number] @0415  THANEY  CULTURE, BLOOD (ROUTINE X 2)     Status: None   Collection Time    03/17/14  2:50 PM      Result Value Ref Range Status   Specimen Description BLOOD LEFT HAND   Final   Special Requests BOTTLES DRAWN AEROBIC ONLY 1CC   Final   Culture  Setup Time     Final   Value: 03/17/2014 20:41     Performed at [facility]   Culture     Final   Value:        BLOOD CULTURE RECEIVED NO GROWTH TO DATE CULTURE WILL BE HELD FOR 5 DAYS BEFORE ISSUING A FINAL NEGATIVE REPORT     Performed at [facility]   Report Status PENDING   Incomplete  CULTURE, BLOOD (ROUTINE X 2)     Status: None   Collection Time    03/17/14  3:25 PM      Result Value Ref Range Status   Specimen Description BLOOD RIGHT HAND   Final   Special Requests BOTTLES DRAWN AEROBIC ONLY 0.5CC   Final   Culture  Setup Time     Final   Value: 03/17/2014 20:42     Performed at [facility]   Culture     Final   Value:        BLOOD CULTURE RECEIVED NO GROWTH TO DATE CULTURE WILL BE HELD FOR 5 DAYS BEFORE ISSUING A FINAL NEGATIVE REPORT     Performed at [facility]   Report Status PENDING   Incomplete    Coagulation Studies: No results found for this basename: LABPROT, INR,  in the last 72 hours  Imaging: No results found.  Medications:  I have reviewed the patient's current medications. Scheduled: . antiseptic oral rinse  15 mL Mouth Rinse QID  . cefTRIAXone (ROCEPHIN)  IV  2 g Intravenous Q24H  . chlorhexidine  15 mL Mouth Rinse BID  . feeding supplement (PRO-STAT SUGAR FREE 64)  60 mL Per Tube BID  . feeding supplement (VITAL HIGH PROTEIN)  1,000 mL Per Tube  Q24H  . heparin subcutaneous  5,000 Units Subcutaneous 3 times per day  . insulin aspart  2-6 Units Subcutaneous 6 times per day  . ipratropium-albuterol  3 mL Nebulization Q4H  . pantoprazole sodium  40 mg Per Tube Daily  . THROMBI-PAD  1 each Topical Once    Assessment/Plan: 68 year old female s/p arrest.  Underwent TH and warmed since 03/16/2014.  Has had no significant improvement since that time.  Patient has been unable to come off sedation due to agitation.  EEG performed and is only significant for slowing.  Head CT was done on 5/29 and shows no significant edema.  Clinically patient is clearly not brain dead. Further investigation such as  MRI and EEG off sedation are technically unable to be performed (patient obese and agitated off sedation).  With the patient having been down for a prolonged period of time and evidence of other end organ damage it is likely that her brian was affected as well.   Patient has multiple other confounding issues such as her metabolic status, obesity, OSA and renal insufficiency.   Prognosis for functional recovery is poor and will clearly not occur over the short run, if at all.  Would expect a very long and complicated course of 6-12 months or longer at best that will likely have a less than optimal end point.    Case discussed with Dr. Tyson AliasFeinstein   LOS: 7 days   Thana FarrLeslie Lorae Roig, MD [facility] [phone] 03/30/2014  9:33 AM

## 2014-04-16 NOTE — Progress Notes (Signed)
04/01/2014- Resp care note- Pt suctioned and terminally extubated at 1730 as per MD order.  RN at bedside for procedure.  Pt placed on 2lpm cannula post extubation and family immediately brought to bedside.  Pt appeared comfortable with no increased WOB post extubation.  s Naphtali Riede rrt. rcp

## 2014-04-16 NOTE — Progress Notes (Signed)
Nutrition Brief Note  Chart reviewed. Pt now transitioning to comfort care.  No further nutrition interventions warranted at this time.  Please re-consult as needed.   Kynisha Memon Hawkins RD, LDN   

## 2014-04-16 NOTE — Progress Notes (Signed)
Morphine IV 25 CCs wasted via sink with Erin E. Smith,R.N.

## 2014-04-16 DEATH — deceased

## 2015-05-18 IMAGING — CR DG CHEST 1V PORT
1 series · 1 of 1 positions shown · non-contrast
Comparison: 03/14/2014 and earlier.

CLINICAL DATA: 67-year-old female central line placement. Initial
encounter.

EXAM:
PORTABLE CHEST - 1 VIEW

[AP]
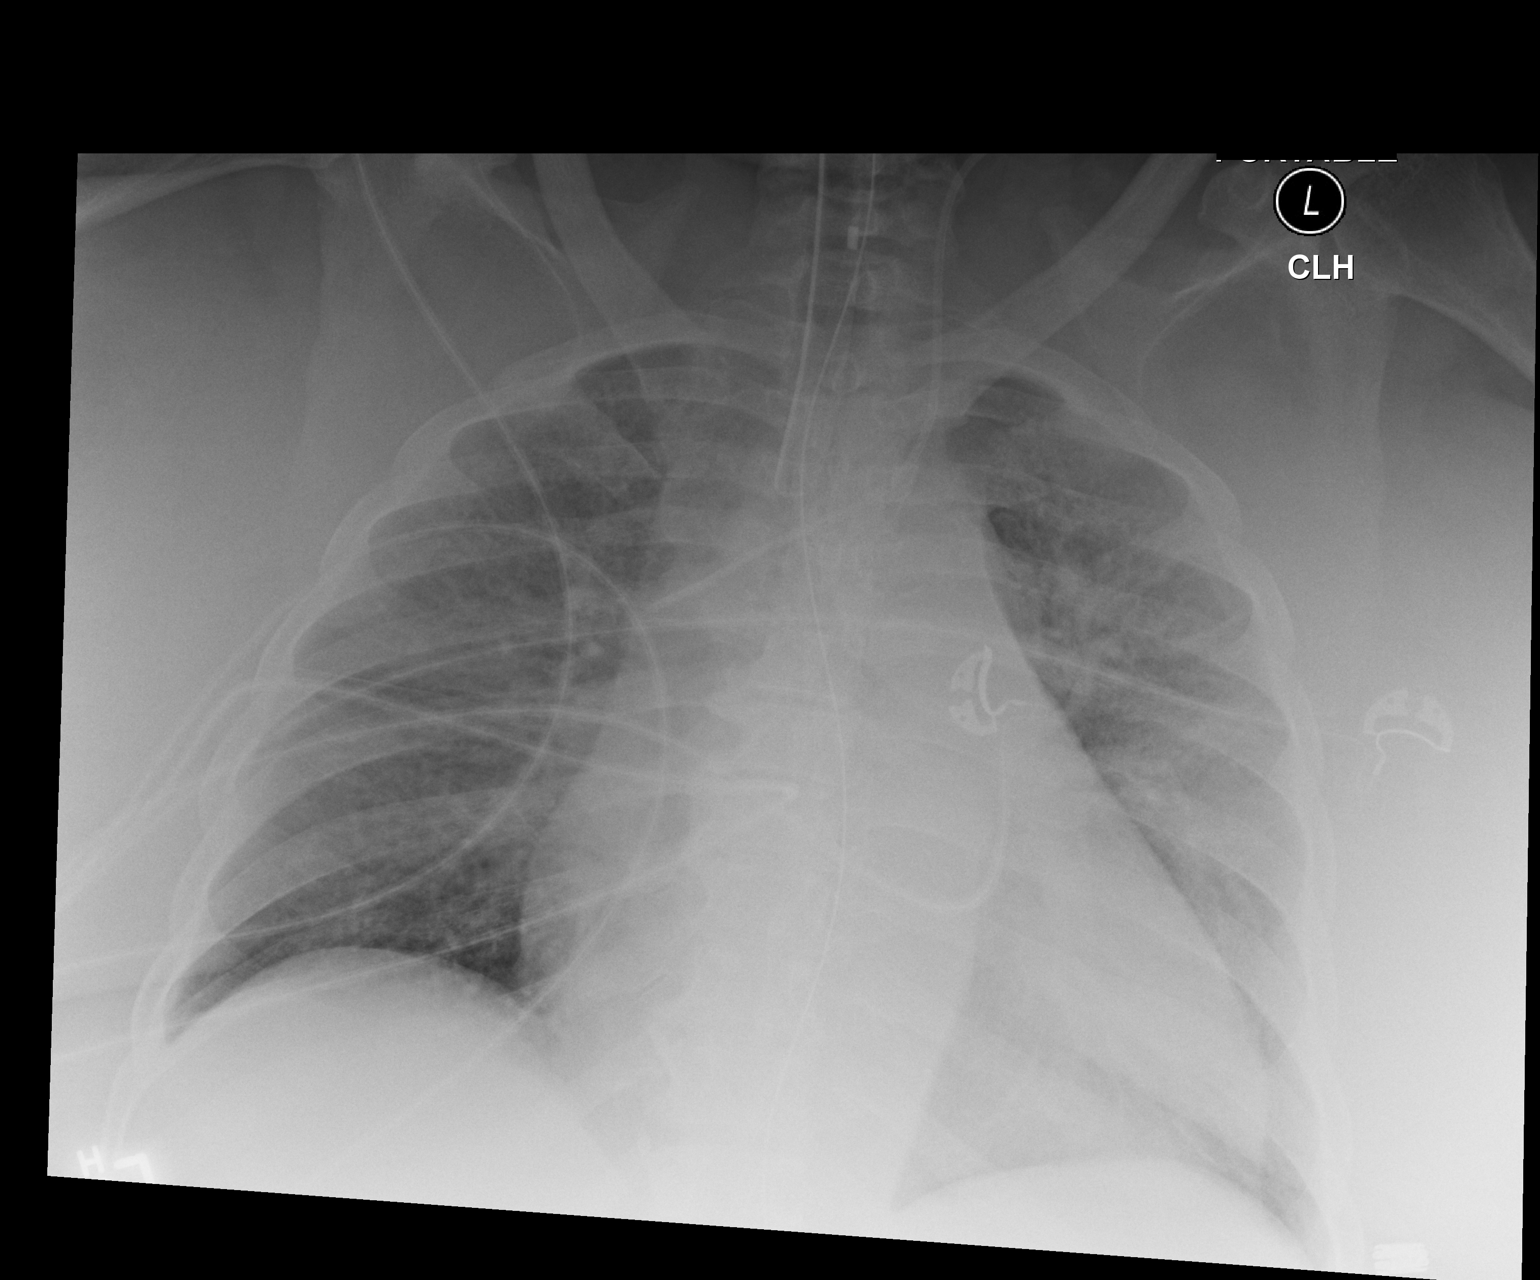

[1 of 1 positions shown; findings below may reference images not displayed]

FINDINGS: Portable AP supine view at 5380 hrs. New left IJ approach central
venous catheter, tip projects to the right of midline just above the
carina.

ET tube tip in better position, now just below the clavicles.
Enteric tube courses to the abdomen, tip not included.

No pneumothorax. Mildly improved lung volumes and decrease left
perihilar consolidation. Improved ventilation in the left lung.
Patchy opacity in the right lung not significantly changed.

Stable cardiac size and mediastinal contours.
IMPRESSION: 1. Left IJ central line placed, tip at the level of the SVC. No
pneumothorax.
2. Improved ventilation the left lung.
3. Enteric tube placed, courses to the abdomen. ET tube in good
position.

## 2015-05-19 IMAGING — CR DG CHEST 1V PORT
1 series · 1 of 1 positions shown · non-contrast
Comparison: 03/15/2014, 02/26/2014

CLINICAL DATA: Endotracheal tube placement

EXAM:
PORTABLE CHEST - 1 VIEW

[AP]
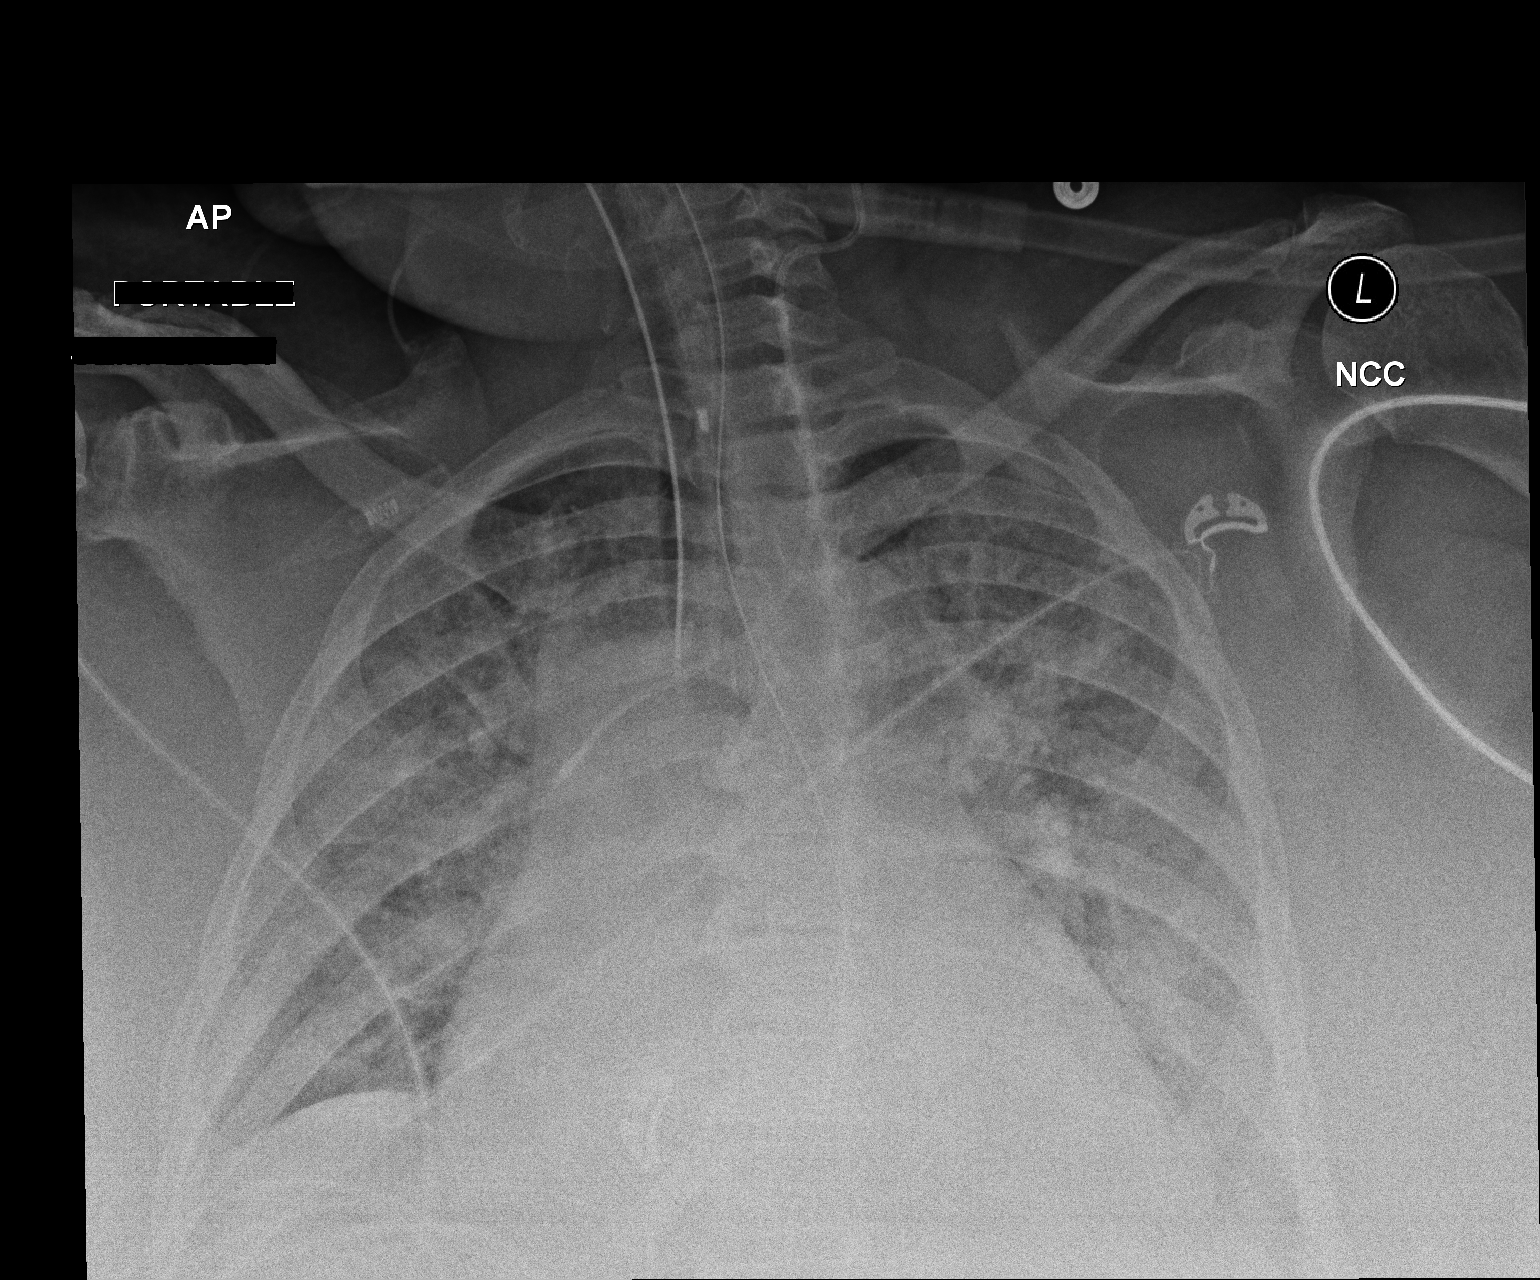

[1 of 1 positions shown; findings below may reference images not displayed]

FINDINGS: Endotracheal tube with the tip 1.5 cm above the carina. Recommend
retracting the endotracheal tube 1.5 cm.

Right jugular central venous catheter in satisfactory position.
There is a nasogastric tube coursing below the diaphragm with the
tip excluded from the field of view.

There are bilateral interstitial and patchy alveolar airspace
opacities. There is no definite pleural effusion. Stable
cardiomegaly.

Unremarkable osseous structures.
IMPRESSION: 1. Endotracheal tube with the tip 1.5 cm above the carina. Recommend
retracting the endotracheal tube 1.5 cm.
2. Bilateral interstitial and patchy alveolar airspace opacities.
The findings are concerning for pulmonary edema versus multi lobar
pneumonia versus ARDS.

## 2015-05-20 IMAGING — US US RENAL PORT
1 series · 14 of 25 positions shown · non-contrast
Comparison: 02/27/2014

CLINICAL DATA: Renal failure, elevated BUN and creatinine

EXAM:
RENAL/URINARY TRACT ULTRASOUND COMPLETE

[Series 1: us renal port · 0.27mm/px · 14 of 27 slices shown]
[im 1/27]
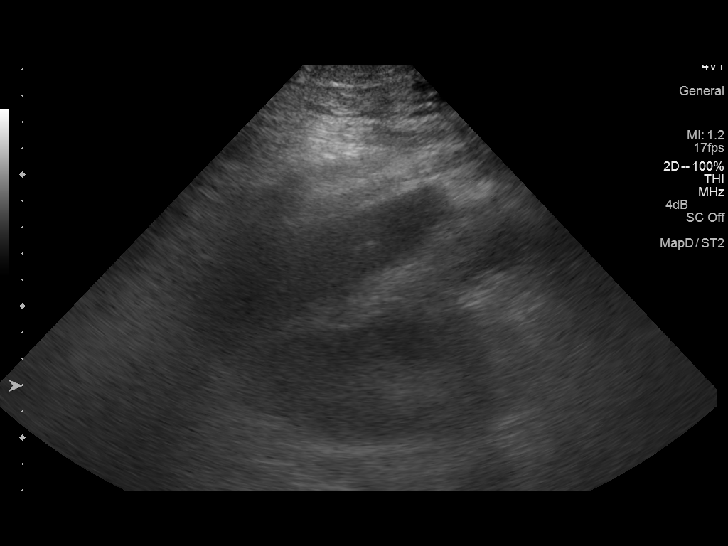
[im 3/27]
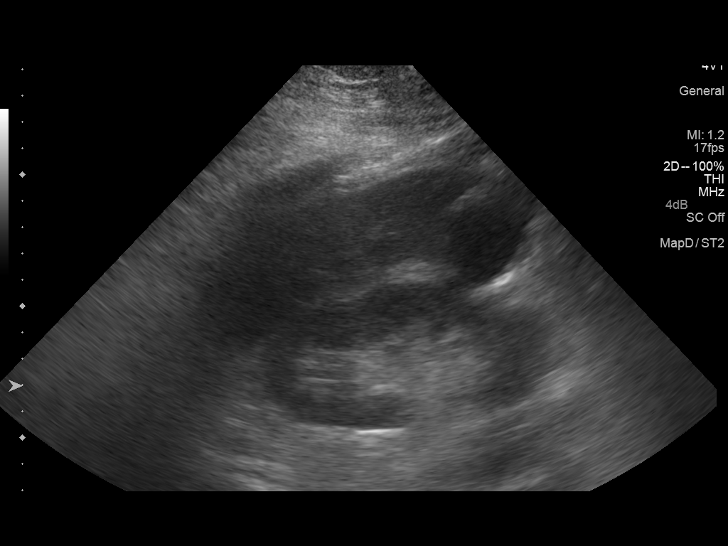
[im 5/27]
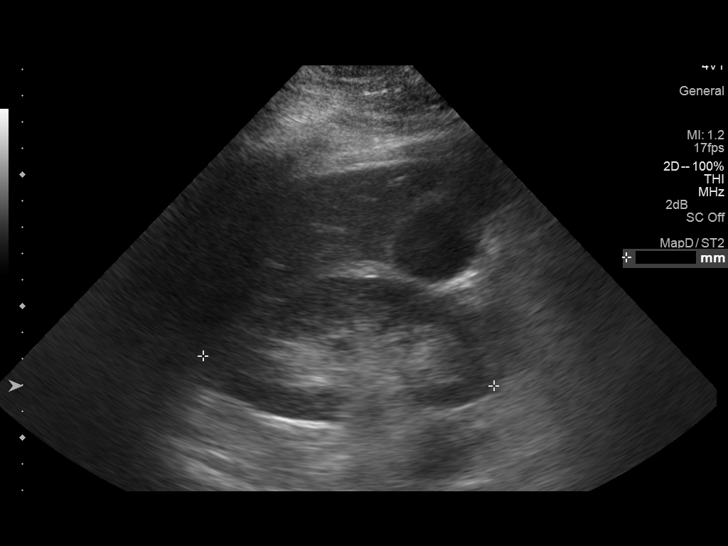
[im 7/27]
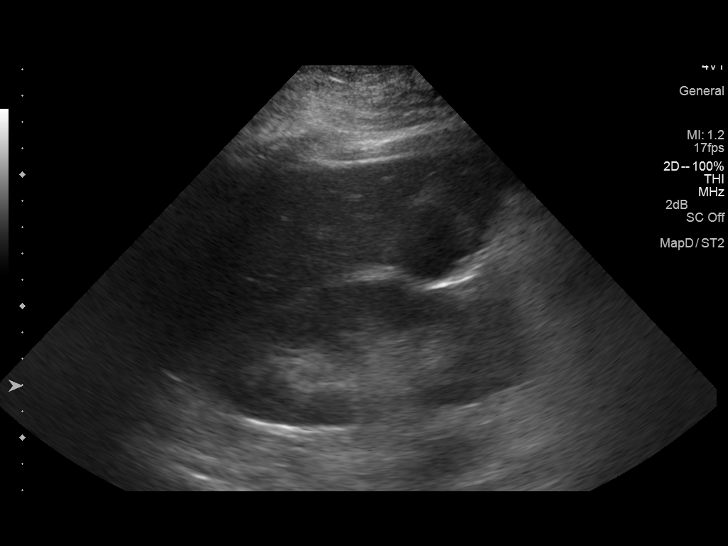
[im 9/27]
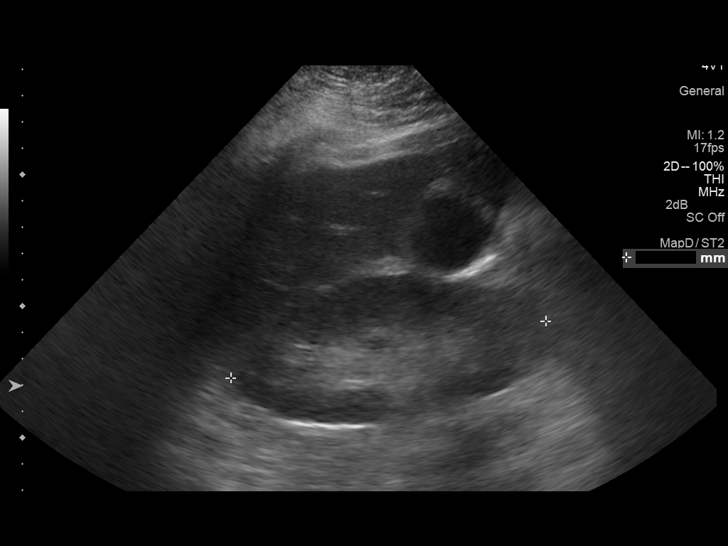
[im 10/27]
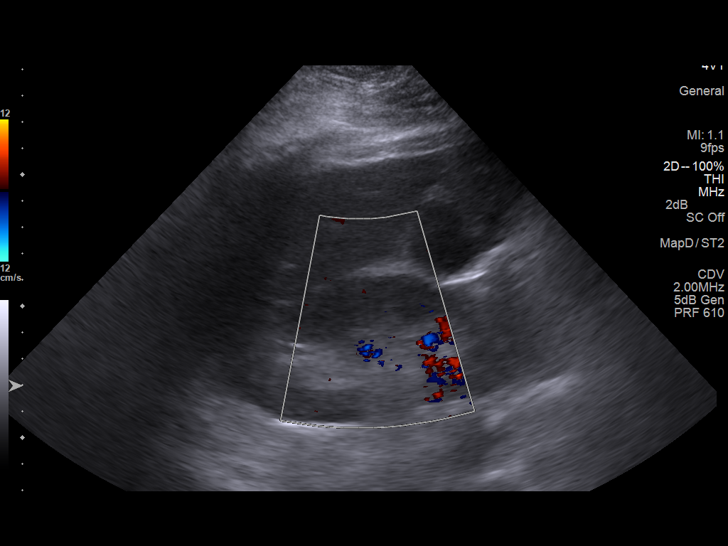
[im 12/27]
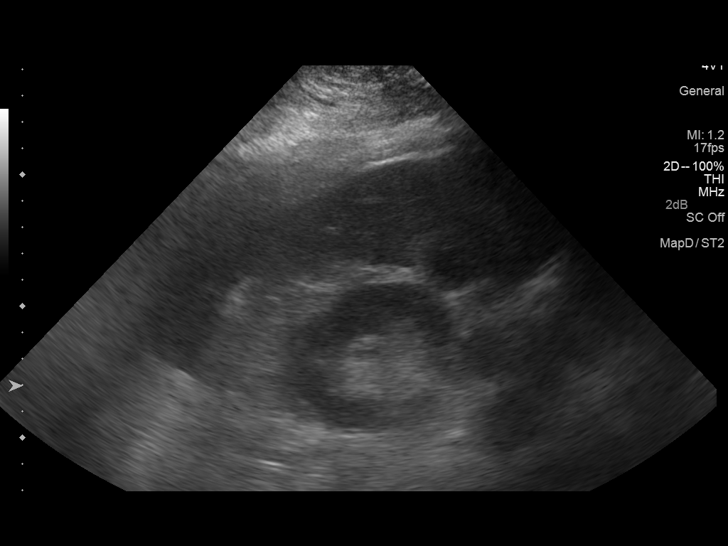
[im 15/27]
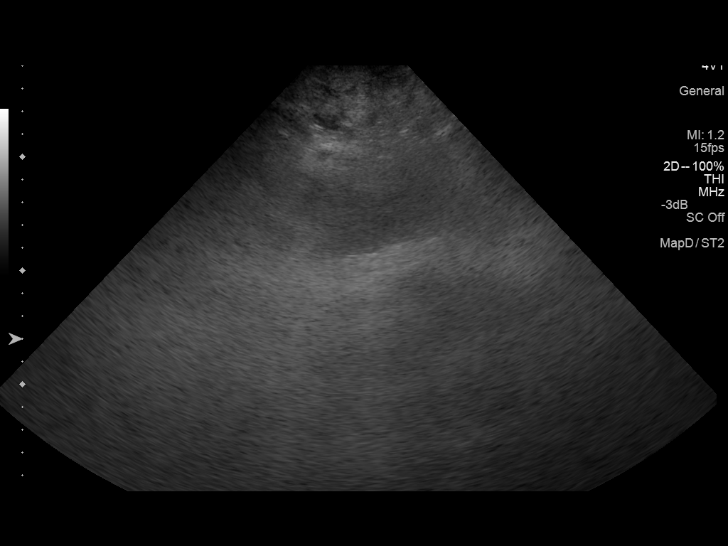
[im 17/27]
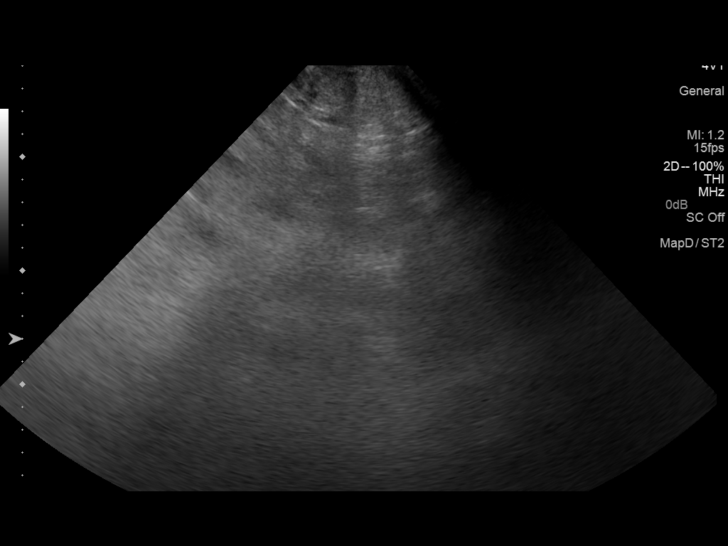
[im 18/27]
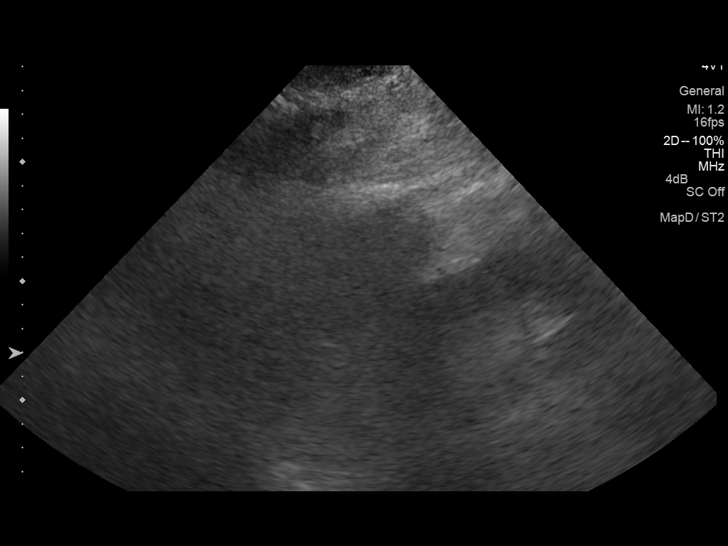
[im 20/27]
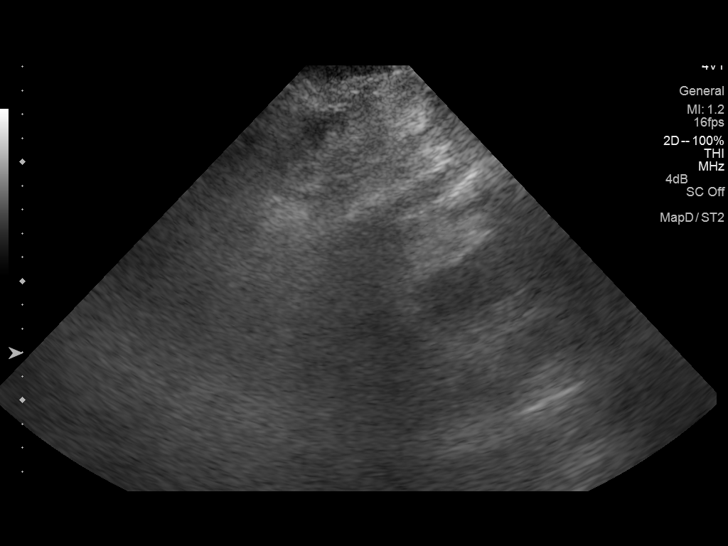
[im 22/27]
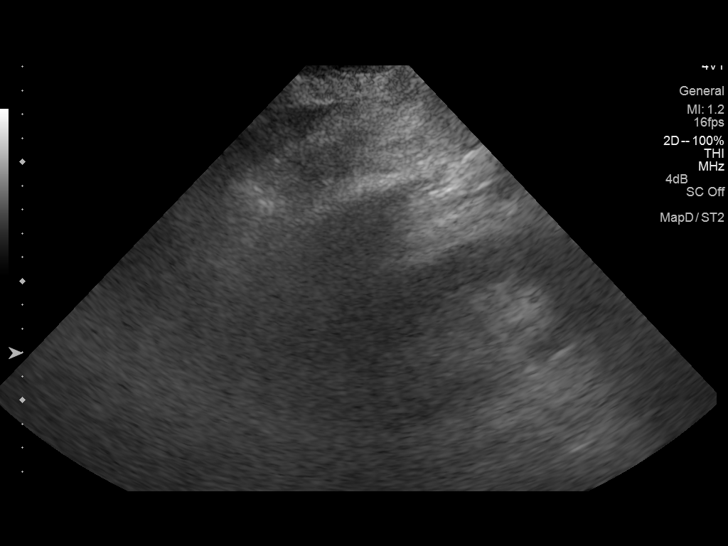
[im 24/27]
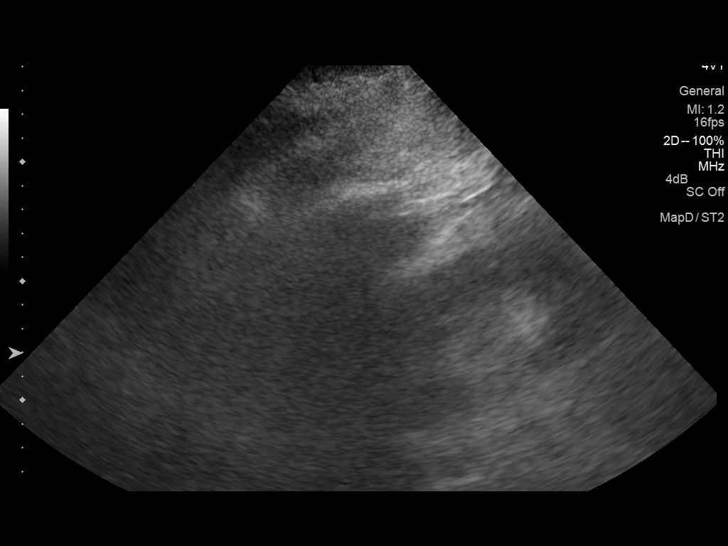
[im 27/27]
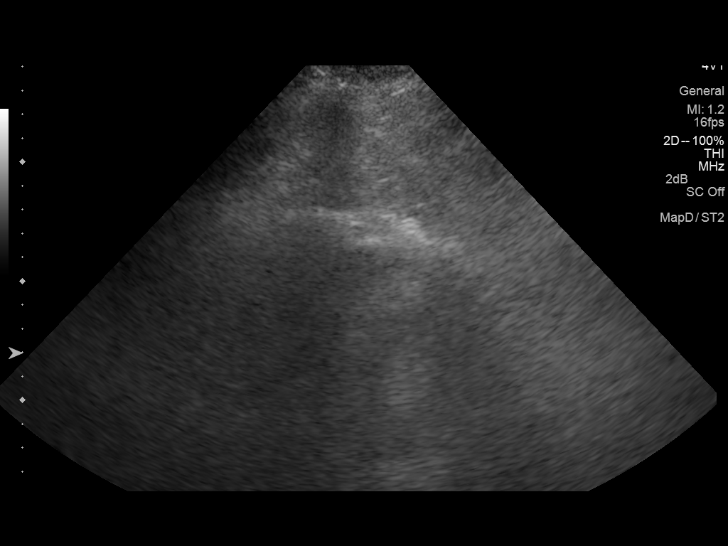

[14 of 25 positions shown; findings below may reference images not displayed]

FINDINGS: Right Kidney:

Length: 12.2 cm. Echogenicity within normal limits. No mass or
hydronephrosis visualized.

Left Kidney:

Length: 12.3 cm. Echogenicity within normal limits. No mass or
hydronephrosis visualized.

Bladder:

Decompressed by Foley catheter.  Not visualized.
IMPRESSION: Normal renal ultrasound for age.

## 2015-05-24 IMAGING — CR DG CHEST 1V PORT
1 series · 1 of 1 positions shown · non-contrast
Comparison: Portable chest radiograph dated 03/17/2014

CLINICAL DATA: check ETT

EXAM:
PORTABLE CHEST - 1 VIEW

[AP]
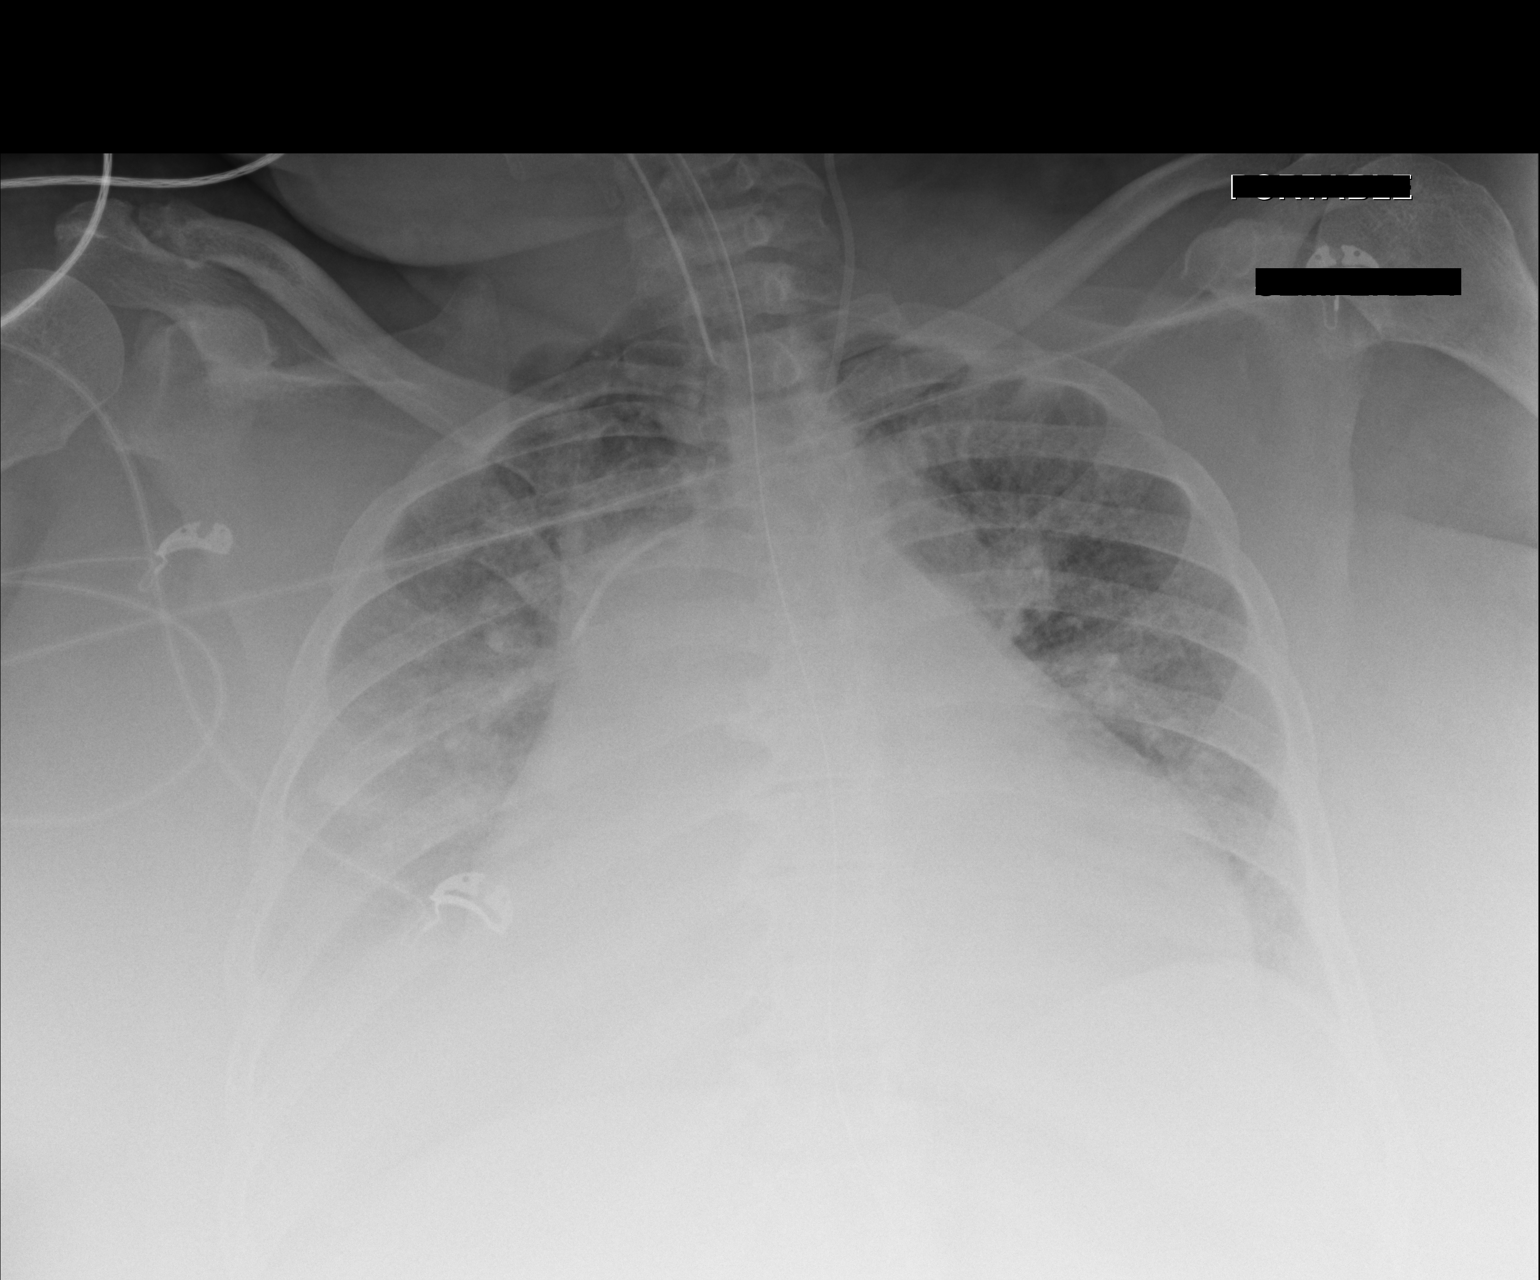

[1 of 1 positions shown; findings below may reference images not displayed]

FINDINGS: Low lung volumes. Stable cardiomegaly. An endotracheal tube tip at
the level of the thoracic inlet. Advancement of 2.5-3 cm is
recommended. A left internal jugular catheter tip at the level
superior vena cava. An enteric tube tip not viewed on this study.
Diffuse bilateral pulmonary opacities are appreciated. There is
prominence of the interstitial markings and peribronchial cuffing.
Confluent density within the right lung base and a possible blunting
of the right costophrenic angle. No acute osseous abnormalities.
IMPRESSION: Endotracheal tube tip at the level of the thoracic inlet advancement
of 2.5-3 cm is recommended. These results will be called to the
ordering clinician or representative by the Radiologist Assistant,
and communication documented in the PACS or zVision Dashboard.

Remaining support lines and tubes unremarkable

Pulmonary edema and possibly small effusion.

Stable cardiomegaly
